# Patient Record
Sex: Male | Born: 1960 | Race: White | Hispanic: No | Marital: Single | Smoking: Current every day smoker
Health system: Southern US, Community
[De-identification: ages and names within clinical notes are randomized; demographics above are authoritative.]

## PROBLEM LIST (undated history)

## (undated) DIAGNOSIS — F1010 Alcohol abuse, uncomplicated: Secondary | ICD-10-CM

---

## 2003-10-12 ENCOUNTER — Emergency Department (HOSPITAL_COMMUNITY): Admission: EM | Admit: 2003-10-12 | Discharge: 2003-10-13 | Payer: Self-pay | Admitting: Emergency Medicine

## 2013-06-03 ENCOUNTER — Emergency Department (HOSPITAL_COMMUNITY): Payer: Medicare Other

## 2013-06-03 ENCOUNTER — Emergency Department (HOSPITAL_COMMUNITY)
Admission: EM | Admit: 2013-06-03 | Discharge: 2013-06-04 | Disposition: A | Discharge status: Home / Self Care | Payer: Medicare Other | Attending: Emergency Medicine | Admitting: Emergency Medicine

## 2013-06-03 ENCOUNTER — Encounter (HOSPITAL_COMMUNITY): Payer: Self-pay | Admitting: Emergency Medicine

## 2013-06-03 DIAGNOSIS — R112 Nausea with vomiting, unspecified: Secondary | ICD-10-CM | POA: Insufficient documentation

## 2013-06-03 DIAGNOSIS — R609 Edema, unspecified: Secondary | ICD-10-CM | POA: Insufficient documentation

## 2013-06-03 DIAGNOSIS — J189 Pneumonia, unspecified organism: Secondary | ICD-10-CM

## 2013-06-03 DIAGNOSIS — F17200 Nicotine dependence, unspecified, uncomplicated: Secondary | ICD-10-CM | POA: Insufficient documentation

## 2013-06-03 DIAGNOSIS — R0781 Pleurodynia: Secondary | ICD-10-CM

## 2013-06-03 DIAGNOSIS — J159 Unspecified bacterial pneumonia: Secondary | ICD-10-CM | POA: Insufficient documentation

## 2013-06-03 LAB — COMPREHENSIVE METABOLIC PANEL
ALT: 7 U/L (ref 0–53)
AST: 16 U/L (ref 0–37)
Albumin: 3.5 g/dL (ref 3.5–5.2)
Alkaline Phosphatase: 77 U/L (ref 39–117)
BUN: 3 mg/dL — ABNORMAL LOW (ref 6–23)
CO2: 20 mEq/L (ref 19–32)
Calcium: 8.3 mg/dL — ABNORMAL LOW (ref 8.4–10.5)
Chloride: 90 mEq/L — ABNORMAL LOW (ref 96–112)
Creatinine, Ser: 0.44 mg/dL — ABNORMAL LOW (ref 0.50–1.35)
GFR calc Af Amer: >90 mL/min (ref >90)
GFR calc non Af Amer: >90 mL/min (ref >90)
Glucose, Bld: 85 mg/dL (ref 70–99)
Potassium: 3.8 mEq/L (ref 3.7–5.3)
Sodium: 127 mEq/L — ABNORMAL LOW (ref 137–147)
Total Bilirubin: <0.2 mg/dL — ABNORMAL LOW (ref 0.3–1.2)
Total Protein: 6.6 g/dL (ref 6.0–8.3)

## 2013-06-03 LAB — CBC WITH DIFFERENTIAL/PLATELET
Basophils Absolute: 0 10*3/uL (ref 0.0–0.1)
Basophils Relative: 0 % (ref 0–1)
Eosinophils Absolute: 0.1 10*3/uL (ref 0.0–0.7)
Eosinophils Relative: 1 % (ref 0–5)
HCT: 32.5 % — ABNORMAL LOW (ref 39.0–52.0)
Hemoglobin: 11.5 g/dL — ABNORMAL LOW (ref 13.0–17.0)
Lymphocytes Relative: 39 % (ref 12–46)
Lymphs Abs: 3.9 10*3/uL (ref 0.7–4.0)
MCH: 34 pg (ref 26.0–34.0)
MCHC: 35.4 g/dL (ref 30.0–36.0)
MCV: 96.2 fL (ref 78.0–100.0)
Monocytes Absolute: 0.8 10*3/uL (ref 0.1–1.0)
Monocytes Relative: 8 % (ref 3–12)
Neutro Abs: 5 10*3/uL (ref 1.7–7.7)
Neutrophils Relative %: 52 % (ref 43–77)
Platelets: 276 10*3/uL (ref 150–400)
RBC: 3.38 MIL/uL — ABNORMAL LOW (ref 4.22–5.81)
RDW: 13.5 % (ref 11.5–15.5)
WBC: 9.9 10*3/uL (ref 4.0–10.5)

## 2013-06-03 LAB — ETHANOL: Alcohol, Ethyl (B): 314 mg/dL — ABNORMAL HIGH (ref 0–11)

## 2013-06-03 LAB — I-STAT TROPONIN, ED: Troponin i, poc: 0 ng/mL (ref 0.00–0.08)

## 2013-06-03 MED ORDER — LEVOFLOXACIN 750 MG PO TABS
750.0000 mg | ORAL_TABLET | Freq: Once | ORAL | Status: AC
  Administered 2013-06-04: 750 mg via ORAL
  Filled 2013-06-03 (×2): qty 1

## 2013-06-03 MED ORDER — NAPROXEN 500 MG PO TABS: 500.0000 mg | ORAL_TABLET | Freq: Two times a day (BID) | ORAL | Status: DC

## 2013-06-03 MED ORDER — LEVOFLOXACIN 750 MG PO TABS: 750.0000 mg | ORAL_TABLET | Freq: Every day | ORAL | Status: DC

## 2013-06-03 NOTE — ED Provider Notes (Signed)
Patient presented to the ER with chest pain. She was constant discomfort on the left side of his chest with a cough.  Face to face Exam: HEENT - PERRLA Lungs - CTAB Heart - RRR, no M/R/G Abd - S/NT/ND Neuro - alert, oriented x3  Plan: Cardiac workup is unremarkable. X-ray does show evidence of pneumonia, will initiate antibiotic coverage, analgesia, treat as an outpatient. Followup with primary doctor.   Gilda Crease, MD 06/03/13 [phone]

## 2013-06-03 NOTE — ED Notes (Signed)
Pt to ED via EMS with c/o chest pain onset yesterday with nausea/vomiting, black spider bite at left hand on Saturday(swelling noted). Pt admits for drinking 2 beers. Per EMS, pt given 324mg  ASA and sublingual nitro x3, BP-118/80, HR-110, O2-94% on room air, CBG-106.

## 2013-06-03 NOTE — Discharge Instructions (Signed)

## 2013-06-03 NOTE — ED Provider Notes (Signed)
CSN: [id number]     Arrival date & time 06/03/13  2058 History   First MD Initiated Contact with Patient 06/03/13 2122     Chief Complaint  Patient presents with  . Chest Pain  . Insect Bite     (Consider location/radiation/quality/duration/timing/severity/associated sxs/prior Treatment) Patient is a 53 y.o. male presenting with chest pain. The history is provided by the patient and the EMS personnel. No language interpreter was used.  Chest Pain Pain location:  L chest Pain quality: aching and throbbing   Pain radiates to:  Does not radiate Pain radiates to the back: no   Pain severity:  Moderate (worse with cough and palpation) Onset quality:  Sudden Duration:  1 day Timing:  Intermittent Progression:  Waxing and waning Chronicity:  New Worsened by:  Coughing Associated symptoms: cough, nausea and vomiting     History reviewed. No pertinent past medical history. History reviewed. No pertinent past surgical history. History reviewed. No pertinent family history. History  Substance Use Topics  . Smoking status: Current Every Day Smoker -- 2.00 packs/day    Types: Cigarettes  . Smokeless tobacco: Never Used  . Alcohol Use: Yes     Comment: daily     Review of Systems  Respiratory: Positive for cough.   Cardiovascular: Positive for chest pain.  Gastrointestinal: Positive for nausea and vomiting. Negative for blood in stool.  Musculoskeletal: Positive for arthralgias.  All other systems reviewed and are negative.     Allergies  Review of patient's allergies indicates no known allergies.  Home Medications   Prior to Admission medications   Not on File   BP 100/69  Pulse 79  Temp(Src) 97.6 F (36.4 C) (Oral)  Resp 14  SpO2 90% Physical Exam  Constitutional: He is oriented to person, place, and time.  HENT:  Head: Normocephalic and atraumatic.  Eyes: Pupils are equal, round, and reactive to light.  Neck: Normal range of motion.  Cardiovascular: Normal  rate and regular rhythm.   Pulmonary/Chest: Effort normal and breath sounds normal. He exhibits tenderness.  Abdominal: Soft. Bowel sounds are normal.  Musculoskeletal: He exhibits edema and tenderness.  Mild edema noted to dorsal aspect of left hand, ? Insect bite  Lymphadenopathy:    He has no cervical adenopathy.  Neurological: He is alert and oriented to person, place, and time.  Skin: Skin is warm and dry.  Psychiatric: He has a normal mood and affect.    ED Course  Procedures (including critical care time) Labs Review Labs Reviewed  CBC WITH DIFFERENTIAL  COMPREHENSIVE METABOLIC PANEL  ETHANOL  I-STAT TROPOININ, ED    Imaging Review No results found.   EKG Interpretation None     Radiology and lab results reviewed, shared with patient.  Concern for early RLL infiltrate.  Negative troponin.  Discussed with Dr. Blinda Leatherwood.  Will treat with levaquin to cover infiltrate and possible soft tissue infection.  Follow-up with PCP. MDM   Final diagnoses:  None    CAP.    Jimmye Norman, NP 06/04/13 [phone]

## 2013-06-04 NOTE — ED Provider Notes (Signed)
Medical screening examination/treatment/procedure(s) were conducted as a shared visit with non-physician practitioner(s) and myself.  I personally evaluated the patient during the encounter.  Please see separate associated note for evaluation and plan.    EKG Interpretation   Date/Time:  Tuesday Jun 03 2013 21:01:09 EDT Ventricular Rate:  83 PR Interval:  145 QRS Duration: 90 QT Interval:  381 QTC Calculation: 448 R Axis:   77 Text Interpretation:  Sinus rhythm Normal ECG Confirmed by Blinda Leatherwood  MD,  CHRISTOPHER ([id number]) on 06/03/2013 11:51:40 PM       Gilda Crease, MD 06/04/13 [phone]

## 2013-08-01 ENCOUNTER — Encounter (HOSPITAL_COMMUNITY): Payer: Self-pay | Admitting: Emergency Medicine

## 2013-08-01 ENCOUNTER — Emergency Department (HOSPITAL_COMMUNITY)
Admission: EM | Admit: 2013-08-01 | Discharge: 2013-08-01 | Discharge status: Against Medical Advice | Payer: Medicare Other | Attending: Emergency Medicine | Admitting: Emergency Medicine

## 2013-08-01 DIAGNOSIS — Y929 Unspecified place or not applicable: Secondary | ICD-10-CM | POA: Insufficient documentation

## 2013-08-01 DIAGNOSIS — W230XXA Caught, crushed, jammed, or pinched between moving objects, initial encounter: Secondary | ICD-10-CM | POA: Diagnosis not present

## 2013-08-01 DIAGNOSIS — S6990XA Unspecified injury of unspecified wrist, hand and finger(s), initial encounter: Principal | ICD-10-CM | POA: Insufficient documentation

## 2013-08-01 DIAGNOSIS — Y939 Activity, unspecified: Secondary | ICD-10-CM | POA: Insufficient documentation

## 2013-08-01 DIAGNOSIS — F17200 Nicotine dependence, unspecified, uncomplicated: Secondary | ICD-10-CM | POA: Diagnosis not present

## 2013-08-01 DIAGNOSIS — Z7982 Long term (current) use of aspirin: Secondary | ICD-10-CM | POA: Insufficient documentation

## 2013-08-01 DIAGNOSIS — S6980XA Other specified injuries of unspecified wrist, hand and finger(s), initial encounter: Secondary | ICD-10-CM | POA: Insufficient documentation

## 2013-08-01 NOTE — ED Notes (Signed)
Slammed L thumb in door on Wednesday.  C/o pain, ecchymosis, swelling, and difficulty bending thumb.

## 2013-08-01 NOTE — ED Notes (Signed)
Pt states his ride was here and he need to leave now, provider Ardelle Parkran PA-C made aware. Risks of leaving AMA explained. Pt informed to return to ED any time.

## 2013-08-29 ENCOUNTER — Encounter (HOSPITAL_COMMUNITY): Payer: Self-pay | Admitting: Emergency Medicine

## 2013-08-29 ENCOUNTER — Emergency Department (HOSPITAL_COMMUNITY): Payer: Medicare Other

## 2013-08-29 ENCOUNTER — Emergency Department (HOSPITAL_COMMUNITY)
Admission: EM | Admit: 2013-08-29 | Discharge: 2013-08-30 | Disposition: A | Discharge status: Home / Self Care | Payer: Medicare Other | Attending: Emergency Medicine | Admitting: Emergency Medicine

## 2013-08-29 DIAGNOSIS — R4182 Altered mental status, unspecified: Secondary | ICD-10-CM | POA: Insufficient documentation

## 2013-08-29 DIAGNOSIS — F17200 Nicotine dependence, unspecified, uncomplicated: Secondary | ICD-10-CM | POA: Insufficient documentation

## 2013-08-29 DIAGNOSIS — F1010 Alcohol abuse, uncomplicated: Secondary | ICD-10-CM | POA: Diagnosis not present

## 2013-08-29 DIAGNOSIS — F10920 Alcohol use, unspecified with intoxication, uncomplicated: Secondary | ICD-10-CM

## 2013-08-29 DIAGNOSIS — R079 Chest pain, unspecified: Secondary | ICD-10-CM | POA: Diagnosis not present

## 2013-08-29 DIAGNOSIS — Z79899 Other long term (current) drug therapy: Secondary | ICD-10-CM | POA: Diagnosis not present

## 2013-08-29 HISTORY — DX: Alcohol abuse, uncomplicated: F10.10

## 2013-08-29 LAB — ETHANOL: Alcohol, Ethyl (B): 338 mg/dL — ABNORMAL HIGH (ref 0–11)

## 2013-08-29 LAB — CBC
HCT: 36.6 % — ABNORMAL LOW (ref 39.0–52.0)
Hemoglobin: 12.9 g/dL — ABNORMAL LOW (ref 13.0–17.0)
MCH: 33.8 pg (ref 26.0–34.0)
MCHC: 35.2 g/dL (ref 30.0–36.0)
MCV: 95.8 fL (ref 78.0–100.0)
Platelets: 317 10*3/uL (ref 150–400)
RBC: 3.82 MIL/uL — ABNORMAL LOW (ref 4.22–5.81)
RDW: 13.5 % (ref 11.5–15.5)
WBC: 9.1 10*3/uL (ref 4.0–10.5)

## 2013-08-29 LAB — COMPREHENSIVE METABOLIC PANEL
ALT: 18 U/L (ref 0–53)
AST: 35 U/L (ref 0–37)
Albumin: 4 g/dL (ref 3.5–5.2)
Alkaline Phosphatase: 95 U/L (ref 39–117)
Anion gap: 17 — ABNORMAL HIGH (ref 5–15)
BUN: 4 mg/dL — ABNORMAL LOW (ref 6–23)
CO2: 20 mEq/L (ref 19–32)
Calcium: 8.8 mg/dL (ref 8.4–10.5)
Chloride: 97 mEq/L (ref 96–112)
Creatinine, Ser: 0.52 mg/dL (ref 0.50–1.35)
GFR calc Af Amer: >90 mL/min (ref >90)
GFR calc non Af Amer: >90 mL/min (ref >90)
Glucose, Bld: 83 mg/dL (ref 70–99)
Potassium: 3.9 mEq/L (ref 3.7–5.3)
Sodium: 134 mEq/L — ABNORMAL LOW (ref 137–147)
Total Bilirubin: <0.2 mg/dL — ABNORMAL LOW (ref 0.3–1.2)
Total Protein: 7.4 g/dL (ref 6.0–8.3)

## 2013-08-29 LAB — I-STAT TROPONIN, ED: Troponin i, poc: 0 ng/mL (ref 0.00–0.08)

## 2013-08-29 MED ORDER — NICOTINE 21 MG/24HR TD PT24
21.0000 mg | MEDICATED_PATCH | Freq: Once | TRANSDERMAL | Status: DC
  Administered 2013-08-29: 21 mg via TRANSDERMAL
  Filled 2013-08-29: qty 1
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
## 2013-08-29 NOTE — ED Notes (Signed)
rn asked if pt has any pain, pt reports he has "smoking pain". Meaning he wants to go smoke

## 2013-08-29 NOTE — ED Provider Notes (Signed)
CSN: [id number]     Arrival date & time 08/29/13  1814 History   First MD Initiated Contact with Patient 08/29/13 1817     Chief Complaint  Patient presents with  . Chest Pain     (Consider location/radiation/quality/duration/timing/severity/associated sxs/prior Treatment) HPI Comments: Patient who is an alcoholic, drinks several beers multiple times per day, apparently found lying outside. EMS was called for transport. Patient is clinically intoxicated. Stated he was walking outside looking for his dog when he had chest pain and fell to the ground. Denies hitting his head or having neck pain. Currently 2/10 L CP. He cannot give details about his risk factor profile. States he had a stroke in the 1980s but cannot tell me what caused this. Level V caveat due to CP.   Patient is a 53 y.o. male presenting with chest pain. The history is provided by the patient and the EMS personnel.  Chest Pain   Past Medical History  Diagnosis Date  . ETOH abuse    History reviewed. No pertinent past surgical history. No family history on file. History  Substance Use Topics  . Smoking status: Current Every Day Smoker -- 2.00 packs/day    Types: Cigarettes  . Smokeless tobacco: Never Used  . Alcohol Use: Yes     Comment: daily     Review of Systems  Unable to perform ROS: Mental status change  Cardiovascular: Positive for chest pain.      Allergies  Review of patient's allergies indicates no known allergies.  Home Medications   Prior to Admission medications   Medication Sig Start Date End Date Taking? Authorizing Provider  albuterol (PROVENTIL HFA;VENTOLIN HFA) 108 (90 BASE) MCG/ACT inhaler Inhale 2 puffs into the lungs every 6 (six) hours as needed for wheezing or shortness of breath.    Historical Provider, MD  Aspirin-Salicylamide-Caffeine (BC HEADACHE POWDER PO) Take 1 packet by mouth daily as needed (Pain).    Historical Provider, MD   BP 107/61  Pulse 85  Temp(Src) 97.8 F (36.6  C) (Oral)  Resp 16  SpO2 96%  Physical Exam  Nursing note and vitals reviewed. Constitutional: He is oriented to person, place, and time. He appears well-developed and well-nourished.  HENT:  Head: Normocephalic and atraumatic. Head is without raccoon's eyes and without Battle's sign.  Right Ear: Tympanic membrane, external ear and ear canal normal. No hemotympanum.  Left Ear: Tympanic membrane, external ear and ear canal normal. No hemotympanum.  Nose: Nose normal. No nasal septal hematoma.  Mouth/Throat: Oropharynx is clear and moist.  Eyes: Conjunctivae, EOM and lids are normal. Pupils are equal, round, and reactive to light. Right eye exhibits no discharge. Left eye exhibits no discharge.  No visible hyphema  Neck: Normal range of motion. Neck supple.  Cardiovascular: Normal rate, regular rhythm and normal heart sounds.   No murmur heard. Pulmonary/Chest: Effort normal and breath sounds normal. No respiratory distress. He has no wheezes. He has no rales.  Abdominal: Soft. There is no tenderness. There is no rebound and no guarding.  Musculoskeletal: Normal range of motion.       Cervical back: He exhibits normal range of motion, no tenderness and no bony tenderness.       Thoracic back: He exhibits no tenderness and no bony tenderness.       Lumbar back: He exhibits no tenderness and no bony tenderness.  Neurological: He is alert and oriented to person, place, and time. He has normal strength and normal reflexes. No  cranial nerve deficit or sensory deficit. Coordination abnormal. GCS eye subscore is 4. GCS verbal subscore is 5. GCS motor subscore is 6.  Slurred speech. Moves all extremities purposefully.   Skin: Skin is warm and dry.  Psychiatric: He has a normal mood and affect.    ED Course  Procedures (including critical care time) Labs Review Labs Reviewed  CBC - Abnormal; Notable for the following:    RBC 3.82 (*)    Hemoglobin 12.9 (*)    HCT 36.6 (*)    All other  components within normal limits  COMPREHENSIVE METABOLIC PANEL - Abnormal; Notable for the following:    Sodium 134 (*)    BUN 4 (*)    Total Bilirubin <0.2 (*)    Anion gap 17 (*)    All other components within normal limits  ETHANOL - Abnormal; Notable for the following:    Alcohol, Ethyl (B) 338 (*)    All other components within normal limits  I-STAT TROPOININ, ED    Imaging Review Dg Chest 2 View  08/29/2013   CLINICAL DATA:  Chest pain  EXAM: CHEST  2 VIEW  COMPARISON:  06/03/2013  FINDINGS: Cardiomediastinal silhouette is stable. Again noted hyperinflation. Emphysematous/bullous changes are again noted. No acute infiltrate or pleural effusion. No pulmonary edema.  IMPRESSION: Hyperinflation. Bilateral emphysematous changes again noted. No active disease.   Electronically Signed   By: Natasha Mead M.D.   On: 08/29/2013 19:57     EKG Interpretation None       7:00 PM Patient seen and examined. Work-up initiated. Medications ordered.   Vital signs reviewed and are as follows: BP 107/61  Pulse 85  Temp(Src) 97.8 F (36.6 C) (Oral)  Resp 16  SpO2 96%  1:45 AM Patient sleeping. Handoff to [facility] NP. Will discharged when sober and safe.    MDM   Final diagnoses:  Alcohol intoxication, uncomplicated   Patient with alcohol intoxication. He c/o CP but cardiac work-up neg. I do not suspect ACS. No risk factors for PE other than age. Do not suspect PE.   He is being allowed to sober up in ED. Will discharge when safe.     Renne Crigler, PA-C 08/30/13 [phone]

## 2013-08-29 NOTE — ED Notes (Addendum)
Per ems pt is from home, pt was laying in the yard, pt thought his dog was in the yard and he was going to get the dog and pt fell, has ingested 6-7 beers. ETOH intoxication. Reports left flank pain, en route complaining of chest pain, EKG unremarkable. 324 mg aspirin PO given. 1 nitro tablet given at 1704

## 2013-08-29 NOTE — ED Notes (Signed)
Bed: [facility] Expected date:  Expected time:  Means of arrival:  Comments: 
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
## 2013-08-29 NOTE — ED Notes (Signed)
Patient transported to X-ray 

## 2013-08-29 NOTE — ED Notes (Signed)
Back from xray

## 2013-08-30 NOTE — ED Provider Notes (Signed)
Medical screening examination/treatment/procedure(s) were performed by non-physician practitioner and as supervising physician I was immediately available for consultation/collaboration.   EKG Interpretation None        Artemus Romanoff, MD 08/30/13 0701 

## 2013-08-30 NOTE — ED Provider Notes (Signed)
Patient has been ambulating, to the bathroom without difficulty he is speaking clear, concise sentences.  Will be discharged home  Arman Filter, NP 08/30/13 [phone]

## 2013-08-30 NOTE — ED Provider Notes (Signed)
Medical screening examination/treatment/procedure(s) were performed by non-physician practitioner and as supervising physician I was immediately available for consultation/collaboration.   EKG Interpretation None        Richardean Canal, MD 08/30/13 [phone]

## 2013-08-30 NOTE — ED Notes (Signed)
Pt has ambulated multiple times to the bathroom with out difficulty and without assistance.

## 2013-08-30 NOTE — Discharge Instructions (Signed)
Please read and follow all provided instructions.  Your diagnoses today include:  1. Alcohol intoxication, uncomplicated    Tests performed today include:  EKG  Blood test for heart muscle damage - no heart attack  Blood counts and electrolytes  Alcohol level - over 4 times the legal limit.  Vital signs. See below for your results today.   Medications prescribed:   None  Take any prescribed medications only as directed.  Home care instructions:  Follow any educational materials contained in this packet.  BE VERY CAREFUL not to take multiple medicines containing Tylenol (also called acetaminophen). Doing so can lead to an overdose which can damage your liver and cause liver failure and possibly death.   Follow-up instructions: Please follow-up with your primary care provider in the next 3 days for further evaluation of your symptoms.   Return instructions:   Please return to the Emergency Department if you experience worsening symptoms.   Please return if you have any other emergent concerns.  Additional Information:  Your vital signs today were: BP 107/61   Pulse 85   Temp(Src) 97.8 F (36.6 C) (Oral)   Resp 16   SpO2 96% If your blood pressure (BP) was elevated above 135/85 this visit, please have this repeated by your doctor within one month. --------------

## 2015-01-22 ENCOUNTER — Encounter (HOSPITAL_COMMUNITY): Payer: Self-pay

## 2015-01-22 ENCOUNTER — Emergency Department (HOSPITAL_COMMUNITY): Payer: Medicare Other

## 2015-01-22 ENCOUNTER — Inpatient Hospital Stay (HOSPITAL_COMMUNITY): Payer: Medicare Other

## 2015-01-22 ENCOUNTER — Inpatient Hospital Stay (HOSPITAL_COMMUNITY)
Admission: EM | Admit: 2015-01-22 | Discharge: 2015-02-10 | DRG: 515 | Disposition: A | Discharge status: Skilled Nursing Facility | Payer: Medicare Other | Attending: General Surgery | Admitting: General Surgery

## 2015-01-22 DIAGNOSIS — E162 Hypoglycemia, unspecified: Secondary | ICD-10-CM | POA: Diagnosis not present

## 2015-01-22 DIAGNOSIS — E46 Unspecified protein-calorie malnutrition: Secondary | ICD-10-CM

## 2015-01-22 DIAGNOSIS — G9340 Encephalopathy, unspecified: Secondary | ICD-10-CM | POA: Diagnosis not present

## 2015-01-22 DIAGNOSIS — Z4659 Encounter for fitting and adjustment of other gastrointestinal appliance and device: Secondary | ICD-10-CM

## 2015-01-22 DIAGNOSIS — Z978 Presence of other specified devices: Secondary | ICD-10-CM

## 2015-01-22 DIAGNOSIS — S0281XA Fracture of other specified skull and facial bones, right side, initial encounter for closed fracture: Principal | ICD-10-CM | POA: Diagnosis present

## 2015-01-22 DIAGNOSIS — Z23 Encounter for immunization: Secondary | ICD-10-CM

## 2015-01-22 DIAGNOSIS — R911 Solitary pulmonary nodule: Secondary | ICD-10-CM | POA: Diagnosis present

## 2015-01-22 DIAGNOSIS — R4182 Altered mental status, unspecified: Secondary | ICD-10-CM | POA: Diagnosis present

## 2015-01-22 DIAGNOSIS — F1020 Alcohol dependence, uncomplicated: Secondary | ICD-10-CM | POA: Diagnosis present

## 2015-01-22 DIAGNOSIS — Z681 Body mass index (BMI) 19 or less, adult: Secondary | ICD-10-CM

## 2015-01-22 DIAGNOSIS — R64 Cachexia: Secondary | ICD-10-CM | POA: Diagnosis present

## 2015-01-22 DIAGNOSIS — S02401A Maxillary fracture, unspecified, initial encounter for closed fracture: Secondary | ICD-10-CM

## 2015-01-22 DIAGNOSIS — D62 Acute posthemorrhagic anemia: Secondary | ICD-10-CM | POA: Diagnosis not present

## 2015-01-22 DIAGNOSIS — F17200 Nicotine dependence, unspecified, uncomplicated: Secondary | ICD-10-CM | POA: Diagnosis present

## 2015-01-22 DIAGNOSIS — S060X9A Concussion with loss of consciousness of unspecified duration, initial encounter: Secondary | ICD-10-CM | POA: Diagnosis not present

## 2015-01-22 DIAGNOSIS — J9600 Acute respiratory failure, unspecified whether with hypoxia or hypercapnia: Secondary | ICD-10-CM | POA: Diagnosis present

## 2015-01-22 DIAGNOSIS — Y908 Blood alcohol level of 240 mg/100 ml or more: Secondary | ICD-10-CM | POA: Diagnosis not present

## 2015-01-22 DIAGNOSIS — J449 Chronic obstructive pulmonary disease, unspecified: Secondary | ICD-10-CM | POA: Diagnosis not present

## 2015-01-22 DIAGNOSIS — S0285XA Fracture of orbit, unspecified, initial encounter for closed fracture: Secondary | ICD-10-CM | POA: Diagnosis present

## 2015-01-22 DIAGNOSIS — I639 Cerebral infarction, unspecified: Secondary | ICD-10-CM

## 2015-01-22 DIAGNOSIS — R410 Disorientation, unspecified: Secondary | ICD-10-CM | POA: Diagnosis not present

## 2015-01-22 DIAGNOSIS — F1010 Alcohol abuse, uncomplicated: Secondary | ICD-10-CM | POA: Diagnosis present

## 2015-01-22 DIAGNOSIS — R9389 Abnormal findings on diagnostic imaging of other specified body structures: Secondary | ICD-10-CM

## 2015-01-22 LAB — URINALYSIS, ROUTINE W REFLEX MICROSCOPIC
Bilirubin Urine: NEGATIVE
GLUCOSE, UA: NEGATIVE mg/dL
HGB URINE DIPSTICK: NEGATIVE
KETONES UR: NEGATIVE mg/dL
LEUKOCYTES UA: NEGATIVE
Nitrite: NEGATIVE
PROTEIN: NEGATIVE mg/dL
Specific Gravity, Urine: 1.011 (ref 1.005–1.030)
pH: 6 (ref 5.0–8.0)

## 2015-01-22 LAB — COMPREHENSIVE METABOLIC PANEL
ALT: 10 U/L — AB (ref 17–63)
AST: 26 U/L (ref 15–41)
Albumin: 3.7 g/dL (ref 3.5–5.0)
Alkaline Phosphatase: 92 U/L (ref 38–126)
Anion gap: 13 (ref 5–15)
BUN: <5 mg/dL — ABNORMAL LOW (ref 6–20)
CHLORIDE: 91 mmol/L — AB (ref 101–111)
CO2: 22 mmol/L (ref 22–32)
Calcium: 8.4 mg/dL — ABNORMAL LOW (ref 8.9–10.3)
Creatinine, Ser: 0.59 mg/dL — ABNORMAL LOW (ref 0.61–1.24)
GFR calc non Af Amer: >60 mL/min (ref >60)
Glucose, Bld: 88 mg/dL (ref 65–99)
POTASSIUM: 4.1 mmol/L (ref 3.5–5.1)
SODIUM: 126 mmol/L — AB (ref 135–145)
Total Bilirubin: 0.4 mg/dL (ref 0.3–1.2)
Total Protein: 6.5 g/dL (ref 6.5–8.1)

## 2015-01-22 LAB — I-STAT CHEM 8, ED
BUN: 4 mg/dL — AB (ref 6–20)
CHLORIDE: 89 mmol/L — AB (ref 101–111)
Calcium, Ion: 0.95 mmol/L — ABNORMAL LOW (ref 1.12–1.23)
Creatinine, Ser: 0.9 mg/dL (ref 0.61–1.24)
Glucose, Bld: 89 mg/dL (ref 65–99)
HEMATOCRIT: 43 % (ref 39.0–52.0)
Hemoglobin: 14.6 g/dL (ref 13.0–17.0)
Potassium: 3.8 mmol/L (ref 3.5–5.1)
SODIUM: 125 mmol/L — AB (ref 135–145)
TCO2: 21 mmol/L (ref 0–100)

## 2015-01-22 LAB — CBC
HEMATOCRIT: 36.6 % — AB (ref 39.0–52.0)
HEMOGLOBIN: 12.7 g/dL — AB (ref 13.0–17.0)
MCH: 32.6 pg (ref 26.0–34.0)
MCHC: 34.7 g/dL (ref 30.0–36.0)
MCV: 93.8 fL (ref 78.0–100.0)
Platelets: 278 10*3/uL (ref 150–400)
RBC: 3.9 MIL/uL — AB (ref 4.22–5.81)
RDW: 13.2 % (ref 11.5–15.5)
WBC: 14.5 10*3/uL — ABNORMAL HIGH (ref 4.0–10.5)

## 2015-01-22 LAB — PREPARE FRESH FROZEN PLASMA
UNIT DIVISION: 0
Unit division: 0

## 2015-01-22 LAB — I-STAT ARTERIAL BLOOD GAS, ED
Acid-base deficit: 5 mmol/L — ABNORMAL HIGH (ref 0.0–2.0)
Bicarbonate: 21.1 meq/L (ref 20.0–24.0)
O2 SAT: 98 %
PCO2 ART: 40.5 mmHg (ref 35.0–45.0)
PH ART: 7.319 — AB (ref 7.350–7.450)
TCO2: 22 mmol/L (ref 0–100)
pO2, Arterial: 110 mmHg — ABNORMAL HIGH (ref 80.0–100.0)

## 2015-01-22 LAB — PROTIME-INR
INR: 0.97 (ref 0.00–1.49)
Prothrombin Time: 13.1 seconds (ref 11.6–15.2)

## 2015-01-22 LAB — ABO/RH: ABO/RH(D): O NEG

## 2015-01-22 LAB — CBG MONITORING, ED: GLUCOSE-CAPILLARY: 94 mg/dL (ref 65–99)

## 2015-01-22 LAB — ETHANOL: Alcohol, Ethyl (B): 314 mg/dL (ref <5)

## 2015-01-22 LAB — CDS SEROLOGY

## 2015-01-22 MED ORDER — ETOMIDATE 2 MG/ML IV SOLN
INTRAVENOUS | Status: AC | PRN
  Administered 2015-01-22: 20 mg via INTRAVENOUS

## 2015-01-22 MED ORDER — SODIUM CHLORIDE 0.9 % IV SOLN
25.0000 ug/h | INTRAVENOUS | Status: DC
  Administered 2015-01-23: 50 ug/h via INTRAVENOUS
  Administered 2015-01-24: 100 ug/h via INTRAVENOUS
  Administered 2015-01-24: 55 ug/h via INTRAVENOUS
  Filled 2015-01-22 (×4): qty 50

## 2015-01-22 MED ORDER — M.V.I. ADULT IV INJ
INJECTION | Freq: Once | INTRAVENOUS | Status: AC
  Administered 2015-01-22: 23:00:00 via INTRAVENOUS
  Filled 2015-01-22: qty 1000

## 2015-01-22 MED ORDER — SODIUM CHLORIDE 0.9 % IV SOLN
INTRAVENOUS | Status: AC | PRN
  Administered 2015-01-22: 1000 mL via INTRAVENOUS

## 2015-01-22 MED ORDER — FENTANYL BOLUS VIA INFUSION
50.0000 ug | INTRAVENOUS | Status: DC | PRN
  Administered 2015-01-25 (×2): 50 ug via INTRAVENOUS
  Filled 2015-01-22: qty 50

## 2015-01-22 MED ORDER — FENTANYL CITRATE (PF) 100 MCG/2ML IJ SOLN: 50.0000 ug | Freq: Once | INTRAMUSCULAR | Status: DC

## 2015-01-22 MED ORDER — ROCURONIUM BROMIDE 50 MG/5ML IV SOLN
INTRAVENOUS | Status: AC | PRN
  Administered 2015-01-22: 70 mg via INTRAVENOUS

## 2015-01-22 MED ORDER — PROPOFOL 1000 MG/100ML IV EMUL
5.0000 ug/kg/min | Freq: Once | INTRAVENOUS | Status: AC
Start: 2015-01-22 — End: 2015-01-22
  Administered 2015-01-22: 20 ug/kg/min via INTRAVENOUS

## 2015-01-22 MED ORDER — PROPOFOL 1000 MG/100ML IV EMUL
0.0000 ug/kg/min | INTRAVENOUS | Status: DC
  Administered 2015-01-22: 5 ug/kg/min via INTRAVENOUS

## 2015-01-22 MED ORDER — IOHEXOL 300 MG/ML  SOLN
80.0000 mL | Freq: Once | INTRAMUSCULAR | Status: AC | PRN
  Administered 2015-01-22: 80 mL via INTRAVENOUS

## 2015-01-22 NOTE — Progress Notes (Signed)
RT assisted ED Physician with intubation. ETCO2 positive color change. Bilateral breath sounds present. CXR confirmed placement. ABG pending. RT will continue to monitor.

## 2015-01-22 NOTE — ED Notes (Signed)
CBG 94 

## 2015-01-22 NOTE — ED Notes (Signed)
Heavy etoh on board.

## 2015-01-22 NOTE — Progress Notes (Signed)
RT decreased RR to 16 and decrease Tidal Volume to 430 Per MD order. RT decreased FIO2 to 50% per ABG results. RT will continue to monitor.

## 2015-01-22 NOTE — ED Notes (Signed)
Propofol cut off due to bp.
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
## 2015-01-22 NOTE — ED Notes (Signed)
See trauma narrator 
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
## 2015-01-22 NOTE — ED Notes (Signed)
ett placed per md.

## 2015-01-22 NOTE — ED Notes (Signed)
Attempted report 

## 2015-01-23 ENCOUNTER — Inpatient Hospital Stay (HOSPITAL_COMMUNITY): Payer: Medicare Other

## 2015-01-23 LAB — CBC
HEMATOCRIT: 35.4 % — AB (ref 39.0–52.0)
Hemoglobin: 12.3 g/dL — ABNORMAL LOW (ref 13.0–17.0)
MCH: 33.2 pg (ref 26.0–34.0)
MCHC: 34.7 g/dL (ref 30.0–36.0)
MCV: 95.7 fL (ref 78.0–100.0)
Platelets: 284 10*3/uL (ref 150–400)
RBC: 3.7 MIL/uL — AB (ref 4.22–5.81)
RDW: 13.5 % (ref 11.5–15.5)
WBC: 15.8 10*3/uL — AB (ref 4.0–10.5)

## 2015-01-23 LAB — TYPE AND SCREEN
ABO/RH(D): O NEG
Antibody Screen: NEGATIVE
Unit division: 0
Unit division: 0

## 2015-01-23 LAB — BASIC METABOLIC PANEL
Anion gap: 9 (ref 5–15)
CALCIUM: 7.8 mg/dL — AB (ref 8.9–10.3)
CHLORIDE: 101 mmol/L (ref 101–111)
CO2: 20 mmol/L — AB (ref 22–32)
CREATININE: 0.41 mg/dL — AB (ref 0.61–1.24)
GFR calc non Af Amer: >60 mL/min (ref >60)
Glucose, Bld: 95 mg/dL (ref 65–99)
Potassium: 3.8 mmol/L (ref 3.5–5.1)
SODIUM: 130 mmol/L — AB (ref 135–145)

## 2015-01-23 LAB — POCT I-STAT 3, ART BLOOD GAS (G3+)
Acid-base deficit: 7 mmol/L — ABNORMAL HIGH (ref 0.0–2.0)
Bicarbonate: 18.4 meq/L — ABNORMAL LOW (ref 20.0–24.0)
O2 Saturation: 96 %
PCO2 ART: 34.8 mmHg — AB (ref 35.0–45.0)
PH ART: 7.329 — AB (ref 7.350–7.450)
Patient temperature: 97.3
TCO2: 20 mmol/L (ref 0–100)
pO2, Arterial: 86 mmHg (ref 80.0–100.0)

## 2015-01-23 LAB — BLOOD PRODUCT ORDER (VERBAL) VERIFICATION

## 2015-01-23 LAB — GLUCOSE, CAPILLARY: Glucose-Capillary: 89 mg/dL (ref 65–99)

## 2015-01-23 LAB — MRSA PCR SCREENING: MRSA BY PCR: NEGATIVE

## 2015-01-23 LAB — TRIGLYCERIDES: TRIGLYCERIDES: 138 mg/dL (ref <150)

## 2015-01-23 MED ORDER — PANTOPRAZOLE SODIUM 40 MG IV SOLR
40.0000 mg | INTRAVENOUS | Status: DC
  Administered 2015-01-23 – 2015-01-31 (×9): 40 mg via INTRAVENOUS
  Filled 2015-01-23 (×11): qty 40

## 2015-01-23 MED ORDER — ANTISEPTIC ORAL RINSE SOLUTION (CORINZ)
7.0000 mL | Freq: Four times a day (QID) | OROMUCOSAL | Status: DC
  Administered 2015-01-23 – 2015-02-01 (×36): 7 mL via OROMUCOSAL

## 2015-01-23 MED ORDER — CHLORHEXIDINE GLUCONATE 0.12% ORAL RINSE (MEDLINE KIT)
15.0000 mL | Freq: Two times a day (BID) | OROMUCOSAL | Status: DC
  Administered 2015-01-23 – 2015-02-01 (×19): 15 mL via OROMUCOSAL

## 2015-01-23 MED ORDER — SODIUM CHLORIDE 0.9 % IV SOLN
INTRAVENOUS | Status: DC | PRN
  Administered 2015-01-23: 04:00:00 via INTRAVENOUS
  Administered 2015-01-24: 60 mL/h via INTRAVENOUS
  Administered 2015-01-27: 23:00:00 via INTRAVENOUS
  Administered 2015-01-27: 60 mL/h via INTRAVENOUS
  Administered 2015-01-30 – 2015-02-01 (×3): via INTRAVENOUS
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
## 2015-01-23 NOTE — Progress Notes (Signed)
Patient transported from ED to CT scan then up to 12M without complications. RT will continue to monitor.
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
## 2015-01-23 NOTE — Progress Notes (Signed)
eLink Physician-Brief Progress Note Patient Name: Gene Clarke DOB: September 08, 1960 MRN: [id number]   Date of Service  01/23/2015  HPI/Events of Note  Notified of need for stress ulcer prophylaxis. Patient is intubated and mechanically ventilated.  eICU Interventions  Will order Protonix IV.     Intervention Category Intermediate Interventions: Best-practice therapies (e.g. DVT, beta blocker, etc.)  [name] 01/23/2015, 3:52 PM

## 2015-01-24 ENCOUNTER — Inpatient Hospital Stay (HOSPITAL_COMMUNITY): Payer: Medicare Other

## 2015-01-24 LAB — CBC
HEMATOCRIT: 34.9 % — AB (ref 39.0–52.0)
HEMOGLOBIN: 11.8 g/dL — AB (ref 13.0–17.0)
MCH: 32.6 pg (ref 26.0–34.0)
MCHC: 33.8 g/dL (ref 30.0–36.0)
MCV: 96.4 fL (ref 78.0–100.0)
Platelets: 280 10*3/uL (ref 150–400)
RBC: 3.62 MIL/uL — ABNORMAL LOW (ref 4.22–5.81)
RDW: 13.5 % (ref 11.5–15.5)
WBC: 11.1 10*3/uL — AB (ref 4.0–10.5)

## 2015-01-24 LAB — BASIC METABOLIC PANEL
ANION GAP: 10 (ref 5–15)
BUN: <5 mg/dL — ABNORMAL LOW (ref 6–20)
CHLORIDE: 104 mmol/L (ref 101–111)
CO2: 23 mmol/L (ref 22–32)
Calcium: 8.8 mg/dL — ABNORMAL LOW (ref 8.9–10.3)
Creatinine, Ser: 0.46 mg/dL — ABNORMAL LOW (ref 0.61–1.24)
GFR calc non Af Amer: >60 mL/min (ref >60)
Glucose, Bld: 94 mg/dL (ref 65–99)
POTASSIUM: 4.1 mmol/L (ref 3.5–5.1)
Sodium: 137 mmol/L (ref 135–145)

## 2015-01-24 LAB — GLUCOSE, CAPILLARY: Glucose-Capillary: 101 mg/dL — ABNORMAL HIGH (ref 65–99)

## 2015-01-24 MED ORDER — MIDAZOLAM HCL 2 MG/2ML IJ SOLN
2.0000 mg | INTRAMUSCULAR | Status: DC | PRN
  Administered 2015-01-27 – 2015-02-01 (×11): 2 mg via INTRAVENOUS
  Filled 2015-01-24 (×10): qty 2

## 2015-01-24 MED ORDER — MIDAZOLAM HCL 2 MG/2ML IJ SOLN
2.0000 mg | INTRAMUSCULAR | Status: DC | PRN
  Filled 2015-01-24 (×3): qty 2

## 2015-01-24 NOTE — Progress Notes (Signed)
Wasted 150cc of fentanyl 2,500 mcg/22650ml concentration into sink, witnessed by nurse Darci NeedleGary Yeung
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
## 2015-01-24 NOTE — Progress Notes (Signed)
Hr 50s QT interval 0.41, fentanyl drip 30 mcg/hr, pt arouses from sleep w/nonpurposeful movements, cbg 10. will titrate fentanyl drip down. Dr Dimple Caseyice informed

## 2015-01-24 NOTE — Progress Notes (Addendum)
Follow up - Trauma and Critical Care  Patient Details:    Gene Clarke is an 55 y.o. male.  Lines/tubes : Airway 7.5 mm (Active)  Secured at (cm) 25 cm 01/24/2015  4:00 AM  Measured From Lips 01/24/2015  7:48 AM  Secured Location Left 01/24/2015  3:27 AM  Secured By [employer] Tube Holder 01/24/2015  7:48 AM  Tube Holder Repositioned Yes 01/24/2015  3:27 AM  Cuff Pressure (cm H2O) 28 cm H2O 01/24/2015  3:27 AM  Site Condition Dry 01/24/2015  7:48 AM     Urethral Catheter brownrn  Temperature probe 16 Fr. (Active)  Indication for Insertion or Continuance of Catheter Unstable critical patients (first 24-48 hours) 01/24/2015  7:48 AM  Site Assessment Clean;Intact 01/24/2015  7:48 AM  Catheter Maintenance Bag below level of bladder;Catheter secured;Drainage bag/tubing not touching floor;Insertion date on drainage bag;No dependent loops;Seal intact 01/24/2015  7:48 AM  Collection Container Standard drainage bag 01/24/2015  7:48 AM  Securement Method Leg strap 01/24/2015  7:48 AM  Urinary Catheter Interventions Unclamped 01/24/2015  7:48 AM  Output (mL) 18 mL 01/24/2015  8:00 AM    Microbiology/Sepsis markers: Results for orders placed or performed during the hospital encounter of 01/22/15  MRSA PCR Screening     Status: None   Collection Time: 01/23/15 12:23 AM  Result Value Ref Range Status   MRSA by PCR NEGATIVE NEGATIVE Final    Comment:        [name] (FDA approved for NASAL specimens only), is one component of a comprehensive MRSA colonization surveillance program. It is not intended to diagnose MRSA infection nor to guide or monitor treatment for MRSA infections.     Anti-infectives:  Anti-infectives    None      Best Practice/Protocols:  VTE Prophylaxis: Mechanical Intermittent Sedation  Consults: Treatment Team:  Newman PiesSu Teoh, MD    Events:  Subjective:    Overnight Issues: Some secretions overnight  Objective:  Vital signs for last 24  hours: Temp:  [98.6 F (37 C)-99.5 F (37.5 C)] 98.9 F (37.2 C) (01/15 0752) Pulse Rate:  [59-107] 80 (01/15 0800) Resp:  [10-21] 10 (01/15 0800) BP: (99-135)/(60-92) 110/75 mmHg (01/15 0800) SpO2:  [96 %-100 %] 98 % (01/15 0800) FiO2 (%):  [40 %] 40 % (01/15 0800)  Hemodynamic parameters for last 24 hours:    Intake/Output from previous day: 01/14 0701 - 01/15 0700 In: 539.5 [I.V.:539.5] Out: 2105 [Urine:2105]  Intake/Output this shift: Total I/O In: 10 [I.V.:10] Out: 18 [Urine:18]  Vent settings for last 24 hours: Vent Mode:  [-] PRVC FiO2 (%):  [40 %] 40 % Set Rate:  [16 bmp] 16 bmp Vt Set:  [430 mL] 430 mL PEEP:  [5 cmH20] 5 cmH20 Pressure Support:  [5 cmH20] 5 cmH20 Plateau Pressure:  [11 cmH20-14 cmH20] 11 cmH20  Physical Exam:  Gen: intubated, sedated HEENT: ETT in place Resp: clear b/l Cardiovascular: RRR Abdomen: soft, NT, ND Ext: min edema Neuro: GCS 6T  Results for orders placed or performed during the hospital encounter of 01/22/15 (from the past 24 hour(s))  I-STAT 3, arterial blood gas (G3+)     Status: Abnormal   Collection Time: 01/23/15  9:54 AM  Result Value Ref Range   pH, Arterial 7.329 (L) 7.350 - 7.450   pCO2 arterial 34.8 (L) 35.0 - 45.0 mmHg   pO2, Arterial 86.0 80.0 - 100.0 mmHg   Bicarbonate 18.4 (L) 20.0 - 24.0 mEq/L   TCO2 20 0 -  100 mmol/L   O2 Saturation 96.0 %   Acid-base deficit 7.0 (H) 0.0 - 2.0 mmol/L   Patient temperature 97.3 F    Collection site RADIAL, ALLEN'S TEST ACCEPTABLE    Sample type ARTERIAL   Provider-confirm verbal Blood Bank order - RBC, FFP, Type & Screen; 2 Units; Order taken: 01/22/2015; 8:55 PM; Emergency Release, STAT 2 units of uncrossmatched emergency release O negative red cells and 2 units of emergency release A plasma were prep     Status: None   Collection Time: 01/23/15  2:41 PM  Result Value Ref Range   Blood product order confirm MD AUTHORIZATION REQUESTED   CBC     Status: Abnormal   Collection  Time: 01/24/15  2:20 AM  Result Value Ref Range   WBC 11.1 (H) 4.0 - 10.5 K/uL   RBC 3.62 (L) 4.22 - 5.81 MIL/uL   Hemoglobin 11.8 (L) 13.0 - 17.0 g/dL   HCT 16.1 (L) 09.6 - 04.5 %   MCV 96.4 78.0 - 100.0 fL   MCH 32.6 26.0 - 34.0 pg   MCHC 33.8 30.0 - 36.0 g/dL   RDW 40.9 81.1 - 91.4 %   Platelets 280 150 - 400 K/uL  Basic metabolic panel     Status: Abnormal   Collection Time: 01/24/15  2:20 AM  Result Value Ref Range   Sodium 137 135 - 145 mmol/L   Potassium 4.1 3.5 - 5.1 mmol/L   Chloride 104 101 - 111 mmol/L   CO2 23 22 - 32 mmol/L   Glucose, Bld 94 65 - 99 mg/dL   BUN <5 (L) 6 - 20 mg/dL   Creatinine, Ser 7.82 (L) 0.61 - 1.24 mg/dL   Calcium 8.8 (L) 8.9 - 10.3 mg/dL   GFR calc non Af Amer >60 >60 mL/min   GFR calc Af Amer >60 >60 mL/min   Anion gap 10 5 - 15     Assessment/Plan:   NEURO  Concussion, alcoholism   Plan: wean sedation and continue to assess responsiveness, watch for withdrawal symptoms  PULM  WBC down, acute respiratory failure from encephalopathy, baseline COPD from imaging   Plan: SBT today, reassess in 2h  CARDIO  No issues   Plan: continue fluids  RENAL  No issues   Plan: continues fluids  GI  NPO   Plan: if unable to extubate, will start tube feeds  ID  Initial concern pneumonia, WBC improved   Plan: watch for signs of infection  HEME  No issues   Plan: start chemical proph  ENDO No issues   Plan: monitor glc  Global Issues      LOS: 2 days   Additional comments:I reviewed the patients new imaging test results. stable chest image  Critical Care Total Time*: 15 Minutes  De Blanch Devansh Riese 01/24/2015  *Care during the described time interval was provided by me and/or other providers on the critical care team.  I have reviewed this patient's available data, including medical history, events of note, physical examination and test results as part of my evaluation.     Addendum: After 2 hours of SBT/sedation hold he still has  no purposeful movements. -no extubation attempt today -unable to place OG yesterday. May attempt today -switch back to [facility] -resume sedation with fentanyl and intermittent versed
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
## 2015-01-25 LAB — BASIC METABOLIC PANEL
ANION GAP: 10 (ref 5–15)
BUN: <5 mg/dL — ABNORMAL LOW (ref 6–20)
CALCIUM: 8.6 mg/dL — AB (ref 8.9–10.3)
CO2: 24 mmol/L (ref 22–32)
Chloride: 106 mmol/L (ref 101–111)
Creatinine, Ser: 0.53 mg/dL — ABNORMAL LOW (ref 0.61–1.24)
GFR calc Af Amer: >60 mL/min (ref >60)
GLUCOSE: 94 mg/dL (ref 65–99)
Potassium: 4 mmol/L (ref 3.5–5.1)
Sodium: 140 mmol/L (ref 135–145)

## 2015-01-25 LAB — CBC
HCT: 33.7 % — ABNORMAL LOW (ref 39.0–52.0)
Hemoglobin: 11 g/dL — ABNORMAL LOW (ref 13.0–17.0)
MCH: 32.2 pg (ref 26.0–34.0)
MCHC: 32.6 g/dL (ref 30.0–36.0)
MCV: 98.5 fL (ref 78.0–100.0)
PLATELETS: 304 10*3/uL (ref 150–400)
RBC: 3.42 MIL/uL — ABNORMAL LOW (ref 4.22–5.81)
RDW: 13.8 % (ref 11.5–15.5)
WBC: 12.7 10*3/uL — AB (ref 4.0–10.5)

## 2015-01-25 LAB — TRIGLYCERIDES: Triglycerides: 58 mg/dL (ref <150)

## 2015-01-25 MED ORDER — LORAZEPAM 1 MG PO TABS: 1.0000 mg | ORAL_TABLET | Freq: Four times a day (QID) | ORAL | Status: AC | PRN

## 2015-01-25 MED ORDER — FOLIC ACID 1 MG PO TABS
1.0000 mg | ORAL_TABLET | Freq: Every day | ORAL | Status: DC
  Filled 2015-01-25: qty 1

## 2015-01-25 MED ORDER — LORAZEPAM 2 MG/ML IJ SOLN: 1.0000 mg | Freq: Four times a day (QID) | INTRAMUSCULAR | Status: AC | PRN

## 2015-01-25 MED ORDER — ADULT MULTIVITAMIN W/MINERALS CH
1.0000 | ORAL_TABLET | Freq: Every day | ORAL | Status: DC
  Administered 2015-01-28 – 2015-02-10 (×11): 1 via ORAL
  Filled 2015-01-25 (×14): qty 1

## 2015-01-25 MED ORDER — VITAMIN B-1 100 MG PO TABS
100.0000 mg | ORAL_TABLET | Freq: Every day | ORAL | Status: DC
  Administered 2015-01-28 – 2015-02-02 (×5): 100 mg via ORAL
  Filled 2015-01-25 (×9): qty 1

## 2015-01-25 MED ORDER — ENOXAPARIN SODIUM 40 MG/0.4ML ~~LOC~~ SOLN
40.0000 mg | SUBCUTANEOUS | Status: DC
  Administered 2015-01-25 – 2015-01-27 (×3): 40 mg via SUBCUTANEOUS
  Filled 2015-01-25 (×3): qty 0.4

## 2015-01-25 MED ORDER — FOLIC ACID 5 MG/ML IJ SOLN
1.0000 mg | Freq: Every day | INTRAMUSCULAR | Status: DC
  Administered 2015-01-25 – 2015-02-10 (×16): 1 mg via INTRAVENOUS
  Filled 2015-01-25 (×18): qty 0.2

## 2015-01-25 MED ORDER — THIAMINE HCL 100 MG/ML IJ SOLN
100.0000 mg | Freq: Every day | INTRAMUSCULAR | Status: DC
  Administered 2015-01-25 – 2015-01-31 (×4): 100 mg via INTRAVENOUS
  Filled 2015-01-25 (×9): qty 1
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
## 2015-01-25 NOTE — Care Management Important Message (Signed)
Important Message  Patient Details  Name: Gene Clarke MRN: [id number] Date of Birth: [birth year]   Medicare Important Message Given:  Yes    Bernadette HoitShoffner, Joanne Brander Coleman 01/25/2015, 12:32 PM

## 2015-01-25 NOTE — Progress Notes (Signed)
Cm

## 2015-01-26 ENCOUNTER — Inpatient Hospital Stay (HOSPITAL_COMMUNITY): Payer: Medicare Other

## 2015-01-26 LAB — CBC
HCT: 33.9 % — ABNORMAL LOW (ref 39.0–52.0)
HEMOGLOBIN: 11 g/dL — AB (ref 13.0–17.0)
MCH: 32.2 pg (ref 26.0–34.0)
MCHC: 32.4 g/dL (ref 30.0–36.0)
MCV: 99.1 fL (ref 78.0–100.0)
Platelets: 319 10*3/uL (ref 150–400)
RBC: 3.42 MIL/uL — AB (ref 4.22–5.81)
RDW: 13.9 % (ref 11.5–15.5)
WBC: 16.8 10*3/uL — AB (ref 4.0–10.5)

## 2015-01-26 LAB — BASIC METABOLIC PANEL
ANION GAP: 15 (ref 5–15)
CHLORIDE: 104 mmol/L (ref 101–111)
CO2: 23 mmol/L (ref 22–32)
Calcium: 9 mg/dL (ref 8.9–10.3)
Creatinine, Ser: 0.54 mg/dL — ABNORMAL LOW (ref 0.61–1.24)
GFR calc Af Amer: >60 mL/min (ref >60)
GLUCOSE: 102 mg/dL — AB (ref 65–99)
POTASSIUM: 3.8 mmol/L (ref 3.5–5.1)
Sodium: 142 mmol/L (ref 135–145)

## 2015-01-26 MED ORDER — ATROPINE SULFATE 0.1 MG/ML IJ SOLN
INTRAMUSCULAR | Status: AC
  Filled 2015-01-26: qty 10

## 2015-01-26 NOTE — Progress Notes (Signed)
Patient ID: Gene Clarke, male   DOB: 1960-03-31, 55 y.o.   MRN: [id number] Weaned very well. Was hoping to extubate but, per RT and RN, still having a lot of secretions and not F/C. Will repeat CT head in the AM to evaluate further.  Violeta Gelinas, MD, MPH, FACS Trauma: [phone] General Surgery: [phone]

## 2015-01-26 NOTE — Progress Notes (Signed)
Patient ID: Gene Clarke, male   DOB: 1960/04/28, 55 y.o.   MRN: [id number] Follow up - Trauma Critical Care  Patient Details:    Gene Clarke is an 55 y.o. male.  Lines/tubes : Airway 7.5 mm (Active)  Secured at (cm) 25 cm 01/26/2015  3:33 AM  Measured From Lips 01/26/2015  3:33 AM  Secured Location Right 01/26/2015  3:33 AM  Secured By [employer] 01/26/2015  3:33 AM  Tube Holder Repositioned Yes 01/26/2015  3:33 AM  Cuff Pressure (cm H2O) 24 cm H2O 01/25/2015  7:39 PM  Site Condition Dry 01/25/2015  8:00 PM     Urethral Catheter brownrn  Temperature probe 16 Fr. (Active)  Indication for Insertion or Continuance of Catheter Unstable critical patients (first 24-48 hours) 01/26/2015  7:09 AM  Site Assessment Clean;Intact 01/25/2015  8:00 PM  Catheter Maintenance Bag below level of bladder;Catheter secured;Drainage bag/tubing not touching floor;Insertion date on drainage bag;No dependent loops;Seal intact 01/26/2015  7:09 AM  Collection Container Standard drainage bag 01/25/2015  8:00 PM  Securement Method Leg strap 01/25/2015  8:00 PM  Urinary Catheter Interventions Unclamped 01/25/2015  8:00 PM  Output (mL) 75 mL 01/26/2015  6:00 AM    Microbiology/Sepsis markers: Results for orders placed or performed during the hospital encounter of 01/22/15  MRSA PCR Screening     Status: None   Collection Time: 01/23/15 12:23 AM  Result Value Ref Range Status   MRSA by PCR NEGATIVE NEGATIVE Final    Comment:        [name] (FDA approved for NASAL specimens only), is one component of a comprehensive MRSA colonization surveillance program. It is not intended to diagnose MRSA infection nor to guide or monitor treatment for MRSA infections.     Anti-infectives:  Anti-infectives    None      Best Practice/Protocols:  VTE Prophylaxis: Lovenox (prophylaxtic dose) Continous Sedation  Consults: Treatment Team:  Newman Pies, MD   Subjective:    Overnight Issues:   stable Objective:  Vital signs for last 24 hours: Temp:  [97.5 F (36.4 C)-99.4 F (37.4 C)] 98.8 F (37.1 C) (01/17 0336) Pulse Rate:  [54-144] 54 (01/17 0600) Resp:  [14-22] 18 (01/17 0600) BP: (100-179)/(63-107) 112/70 mmHg (01/17 0600) SpO2:  [96 %-100 %] 99 % (01/17 0600) FiO2 (%):  [40 %] 40 % (01/17 0400) Weight:  [43.3 kg (95 lb 7.4 oz)] 43.3 kg (95 lb 7.4 oz) (01/17 0400)  Hemodynamic parameters for last 24 hours:    Intake/Output from previous day: 01/16 0701 - 01/17 0700 In: 1434 [I.V.:1434] Out: 930 [Urine:930]  Intake/Output this shift:    Vent settings for last 24 hours: Vent Mode:  [-] PRVC FiO2 (%):  [40 %] 40 % Set Rate:  [16 bmp] 16 bmp Vt Set:  [430 mL] 430 mL PEEP:  [5 cmH20] 5 cmH20 Pressure Support:  [5 cmH20] 5 cmH20 Plateau Pressure:  [10 cmH20-14 cmH20] 12 cmH20  Physical Exam:  General: on vent Neuro: opens eyes, PERL, moves ext some but not F/C, brisk WD to pain HEENT/Neck: ETT and R facial edema Resp: clear to auscultation bilaterally CVS: RRR GI: soft, NT, ND Extremities: calves soft  Results for orders placed or performed during the hospital encounter of 01/22/15 (from the past 24 hour(s))  Triglycerides     Status: None   Collection Time: 01/25/15 10:04 PM  Result Value Ref Range   Triglycerides 58 <150 mg/dL  CBC     Status:  Abnormal   Collection Time: 01/26/15  2:32 AM  Result Value Ref Range   WBC 16.8 (H) 4.0 - 10.5 K/uL   RBC 3.42 (L) 4.22 - 5.81 MIL/uL   Hemoglobin 11.0 (L) 13.0 - 17.0 g/dL   HCT 40.9 (L) 81.1 - 91.4 %   MCV 99.1 78.0 - 100.0 fL   MCH 32.2 26.0 - 34.0 pg   MCHC 32.4 30.0 - 36.0 g/dL   RDW 78.2 95.6 - 21.3 %   Platelets 319 150 - 400 K/uL  Basic metabolic panel     Status: Abnormal   Collection Time: 01/26/15  2:32 AM  Result Value Ref Range   Sodium 142 135 - 145 mmol/L   Potassium 3.8 3.5 - 5.1 mmol/L   Chloride 104 101 - 111 mmol/L   CO2 23 22 - 32 mmol/L   Glucose, Bld 102 (H) 65 - 99 mg/dL    BUN <5 (L) 6 - 20 mg/dL   Creatinine, Ser 0.86 (L) 0.61 - 1.24 mg/dL   Calcium 9.0 8.9 - 57.8 mg/dL   GFR calc non Af Amer >60 >60 mL/min   GFR calc Af Amer >60 >60 mL/min   Anion gap 15 5 - 15    Assessment & Plan: Present on Admission:  . Orbital fracture (HCC)   LOS: 4 days   Additional comments:I reviewed the patient's new clinical lab test results. . Fall Vent dependent resp failure - plan wean to extubate today R tetrapod facial FXs - per Dr. Suszanne Conners, possible ORIF Concussion - some improvement, will need therapies after extubation ABL anemia - mild, stable ETOH abuse - watch for WD, CIWA RUL lung nodule - outpatient F/U FEN - NPO on vent, hold off on TF with planned extubation VTE - PAS, Lovenox Dispo - ICU Critical Care Total Time*: 30 Minutes  Violeta Gelinas, MD, MPH, FACS Trauma: [phone] General Surgery: [phone]  01/26/2015  *Care during the described time interval was provided by me. I have reviewed this patient's available data, including medical history, events of note, physical examination and test results as part of my evaluation.

## 2015-01-26 NOTE — Progress Notes (Signed)
Spoke with Violeta Gelinas, MD about extubating patient.  Patient occasionally having thick secretions, building up and causing distress and tachypnea.  Reccommended that i would avoid extubation at this time, MD agreed.  MD verbal order for cortrak NG tube.
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
## 2015-01-26 NOTE — Progress Notes (Signed)
This RN and Maylon Peppers RD, attempted to place cortrak tube. Tube advanced at ~52cm but met resistance to further advancement. According to cortrak imaging, tube appears to be right before the stomach. Stat Abd CXR ordered to verify placement. May need IR placement.
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
## 2015-01-26 NOTE — Progress Notes (Signed)
Wasted 100 mL of Fentanyl with Barbaraann Faster, in sink. Bag expired, new bag hung

## 2015-01-27 ENCOUNTER — Inpatient Hospital Stay (HOSPITAL_COMMUNITY): Payer: Medicare Other

## 2015-01-27 LAB — BASIC METABOLIC PANEL
Anion gap: 10 (ref 5–15)
BUN: 8 mg/dL (ref 6–20)
CHLORIDE: 110 mmol/L (ref 101–111)
CO2: 23 mmol/L (ref 22–32)
Calcium: 8.7 mg/dL — ABNORMAL LOW (ref 8.9–10.3)
Creatinine, Ser: 0.51 mg/dL — ABNORMAL LOW (ref 0.61–1.24)
GFR calc Af Amer: >60 mL/min (ref >60)
GFR calc non Af Amer: >60 mL/min (ref >60)
Glucose, Bld: 94 mg/dL (ref 65–99)
POTASSIUM: 3.4 mmol/L — AB (ref 3.5–5.1)
SODIUM: 143 mmol/L (ref 135–145)

## 2015-01-27 LAB — CBC
HCT: 31.3 % — ABNORMAL LOW (ref 39.0–52.0)
HEMOGLOBIN: 10.3 g/dL — AB (ref 13.0–17.0)
MCH: 32.9 pg (ref 26.0–34.0)
MCHC: 32.9 g/dL (ref 30.0–36.0)
MCV: 100 fL (ref 78.0–100.0)
Platelets: 308 10*3/uL (ref 150–400)
RBC: 3.13 MIL/uL — AB (ref 4.22–5.81)
RDW: 13.8 % (ref 11.5–15.5)
WBC: 11.7 10*3/uL — ABNORMAL HIGH (ref 4.0–10.5)

## 2015-01-27 LAB — HEPATIC FUNCTION PANEL
ALT: 11 U/L — AB (ref 17–63)
AST: 18 U/L (ref 15–41)
Albumin: 2.7 g/dL — ABNORMAL LOW (ref 3.5–5.0)
Alkaline Phosphatase: 70 U/L (ref 38–126)
BILIRUBIN DIRECT: 0.1 mg/dL (ref 0.1–0.5)
Indirect Bilirubin: 0.5 mg/dL (ref 0.3–0.9)
TOTAL PROTEIN: 6 g/dL — AB (ref 6.5–8.1)
Total Bilirubin: 0.6 mg/dL (ref 0.3–1.2)

## 2015-01-27 LAB — GLUCOSE, CAPILLARY
GLUCOSE-CAPILLARY: 84 mg/dL (ref 65–99)
Glucose-Capillary: 109 mg/dL — ABNORMAL HIGH (ref 65–99)

## 2015-01-27 LAB — VITAMIN B12: Vitamin B-12: 553 pg/mL (ref 180–914)

## 2015-01-27 LAB — AMMONIA: Ammonia: 34 umol/L (ref 9–35)

## 2015-01-27 MED ORDER — ENOXAPARIN SODIUM 30 MG/0.3ML ~~LOC~~ SOLN
30.0000 mg | SUBCUTANEOUS | Status: DC
  Administered 2015-01-28 – 2015-02-10 (×13): 30 mg via SUBCUTANEOUS
  Filled 2015-01-27 (×14): qty 0.3

## 2015-01-27 MED ORDER — POTASSIUM CHLORIDE 10 MEQ/100ML IV SOLN
10.0000 meq | INTRAVENOUS | Status: AC
  Administered 2015-01-27 (×2): 10 meq via INTRAVENOUS
  Filled 2015-01-27 (×2): qty 100

## 2015-01-27 MED ORDER — LIDOCAINE VISCOUS 2 % MT SOLN
OROMUCOSAL | Status: AC
  Administered 2015-01-27: 3 mL via NASAL
  Filled 2015-01-27: qty 15

## 2015-01-27 MED ORDER — IOHEXOL 300 MG/ML  SOLN
50.0000 mL | Freq: Once | INTRAMUSCULAR | Status: AC | PRN
  Administered 2015-01-27: 30 mL

## 2015-01-27 MED ORDER — PIVOT 1.5 CAL PO LIQD
1000.0000 mL | ORAL | Status: DC
  Administered 2015-01-27 – 2015-01-31 (×5): 1000 mL
  Filled 2015-01-27 (×9): qty 1000

## 2015-01-27 NOTE — Progress Notes (Signed)
Wasted 225 mL of fentanyl with Cher Nakai, RN in sink
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
## 2015-01-28 ENCOUNTER — Inpatient Hospital Stay (HOSPITAL_COMMUNITY): Payer: Medicare Other

## 2015-01-28 DIAGNOSIS — R4182 Altered mental status, unspecified: Secondary | ICD-10-CM

## 2015-01-28 LAB — CBC
HEMATOCRIT: 31.2 % — AB (ref 39.0–52.0)
Hemoglobin: 10.2 g/dL — ABNORMAL LOW (ref 13.0–17.0)
MCH: 31.9 pg (ref 26.0–34.0)
MCHC: 32.7 g/dL (ref 30.0–36.0)
MCV: 97.5 fL (ref 78.0–100.0)
Platelets: 312 10*3/uL (ref 150–400)
RBC: 3.2 MIL/uL — ABNORMAL LOW (ref 4.22–5.81)
RDW: 13.3 % (ref 11.5–15.5)
WBC: 10.9 10*3/uL — ABNORMAL HIGH (ref 4.0–10.5)

## 2015-01-28 LAB — PHOSPHORUS: PHOSPHORUS: 2.6 mg/dL (ref 2.5–4.6)

## 2015-01-28 LAB — GLUCOSE, CAPILLARY
GLUCOSE-CAPILLARY: 113 mg/dL — AB (ref 65–99)
GLUCOSE-CAPILLARY: 125 mg/dL — AB (ref 65–99)
GLUCOSE-CAPILLARY: 143 mg/dL — AB (ref 65–99)
Glucose-Capillary: 155 mg/dL — ABNORMAL HIGH (ref 65–99)
Glucose-Capillary: 147 mg/dL — ABNORMAL HIGH (ref 65–99)
Glucose-Capillary: 122 mg/dL — ABNORMAL HIGH (ref 65–99)

## 2015-01-28 LAB — BASIC METABOLIC PANEL
Anion gap: 10 (ref 5–15)
BUN: 10 mg/dL (ref 6–20)
CO2: 25 mmol/L (ref 22–32)
CREATININE: 0.42 mg/dL — AB (ref 0.61–1.24)
Calcium: 8.6 mg/dL — ABNORMAL LOW (ref 8.9–10.3)
Chloride: 110 mmol/L (ref 101–111)
GFR calc Af Amer: >60 mL/min (ref >60)
GLUCOSE: 169 mg/dL — AB (ref 65–99)
POTASSIUM: 3.2 mmol/L — AB (ref 3.5–5.1)
SODIUM: 145 mmol/L (ref 135–145)

## 2015-01-28 LAB — TRIGLYCERIDES: Triglycerides: 38 mg/dL (ref <150)

## 2015-01-28 LAB — MAGNESIUM: Magnesium: 1.9 mg/dL (ref 1.7–2.4)

## 2015-01-28 MED ORDER — POTASSIUM CHLORIDE 20 MEQ PO PACK: 40.0000 meq | PACK | Freq: Two times a day (BID) | ORAL | Status: DC

## 2015-01-28 MED ORDER — POTASSIUM CHLORIDE CRYS ER 20 MEQ PO TBCR
40.0000 meq | EXTENDED_RELEASE_TABLET | Freq: Two times a day (BID) | ORAL | Status: AC
  Administered 2015-01-28 (×2): 40 meq via ORAL
  Filled 2015-01-28 (×2): qty 2

## 2015-01-28 NOTE — Procedures (Signed)
History: 55 year old male being evaluated for altered mental status, unresponsiveness  Sedation: None  Technique: This is a 21 channel routine scalp EEG performed at the bedside with bipolar and monopolar montages arranged in accordance to the international 10/20 system of electrode placement. One channel was dedicated to EKG recording.    Background: The background consists of generalized relatively low voltage irregular delta activity. There is a posterior dominant rhythm (PDR) which is poorly sustained seen at times achieving a maximal frequency of 7 Hz.   Photic stimulation: Physiologic driving was not performed  EEG Abnormalities: 1) generalized irregular slow activity  2) slow PDR   Clinical Interpretation: This EEG is consistent with a nonspecific generalized cerebral dysfunction (encephalopathy). There was no seizure or seizure predisposition recorded on this study. Please note that a normal EEG does not preclude the possibility of epilepsy.   Ritta Slot, MD [facility] [phone]  If 7pm- 7am, please page neurology on call as listed in AMION.
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
## 2015-01-28 NOTE — Care Management Note (Signed)
Case Management Note  Patient Details  Name: Gene Clarke MRN: [id number] Date of Birth: June 28, 1960  Subjective/Objective:                    Action/Plan:  Ur updated  Expected Discharge Date:                  Expected Discharge Plan:  Home w Home Health Services  In-House Referral:     Discharge planning Services  CM Consult  Post Acute Care Choice:    Choice offered to:     DME Arranged:    DME Agency:     HH Arranged:    HH Agency:     Status of Service:  In process, will continue to follow  Medicare Important Message Given:  Yes Date Medicare IM Given:    Medicare IM give by:    Date Additional Medicare IM Given:    Additional Medicare Important Message give by:     If discussed at Long Length of Stay Meetings, dates discussed:  01-28-15  Additional Comments:  Kingsley Plan, RN 01/28/2015, 11:01 AM

## 2015-01-28 NOTE — Progress Notes (Signed)
EEG Completed; Results Pending  

## 2015-01-29 ENCOUNTER — Inpatient Hospital Stay (HOSPITAL_COMMUNITY): Payer: Medicare Other

## 2015-01-29 DIAGNOSIS — R410 Disorientation, unspecified: Secondary | ICD-10-CM

## 2015-01-29 LAB — BASIC METABOLIC PANEL
Anion gap: 8 (ref 5–15)
BUN: 14 mg/dL (ref 6–20)
CO2: 25 mmol/L (ref 22–32)
CREATININE: 0.36 mg/dL — AB (ref 0.61–1.24)
Calcium: 9 mg/dL (ref 8.9–10.3)
Chloride: 113 mmol/L — ABNORMAL HIGH (ref 101–111)
GFR calc Af Amer: >60 mL/min (ref >60)
GLUCOSE: 140 mg/dL — AB (ref 65–99)
POTASSIUM: 3.5 mmol/L (ref 3.5–5.1)
Sodium: 146 mmol/L — ABNORMAL HIGH (ref 135–145)

## 2015-01-29 LAB — GLUCOSE, CAPILLARY
GLUCOSE-CAPILLARY: 121 mg/dL — AB (ref 65–99)
Glucose-Capillary: 108 mg/dL — ABNORMAL HIGH (ref 65–99)
Glucose-Capillary: 115 mg/dL — ABNORMAL HIGH (ref 65–99)
Glucose-Capillary: 118 mg/dL — ABNORMAL HIGH (ref 65–99)
Glucose-Capillary: 118 mg/dL — ABNORMAL HIGH (ref 65–99)
Glucose-Capillary: 126 mg/dL — ABNORMAL HIGH (ref 65–99)

## 2015-01-29 MED ORDER — FREE WATER
100.0000 mL | Freq: Three times a day (TID) | Status: DC
  Administered 2015-01-29 – 2015-02-02 (×15): 100 mL
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
## 2015-01-29 NOTE — Progress Notes (Signed)
Pt ETT was  22/21 at the lip. Previous position was 25/24 at the lip. RT was notified and advance ET tube back to 24 at the lip
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
## 2015-01-30 LAB — GLUCOSE, CAPILLARY
GLUCOSE-CAPILLARY: 116 mg/dL — AB (ref 65–99)
GLUCOSE-CAPILLARY: 123 mg/dL — AB (ref 65–99)
GLUCOSE-CAPILLARY: 103 mg/dL — AB (ref 65–99)
GLUCOSE-CAPILLARY: 97 mg/dL (ref 65–99)
Glucose-Capillary: 120 mg/dL — ABNORMAL HIGH (ref 65–99)
Glucose-Capillary: 123 mg/dL — ABNORMAL HIGH (ref 65–99)

## 2015-01-30 LAB — BASIC METABOLIC PANEL
Anion gap: 8 (ref 5–15)
BUN: 14 mg/dL (ref 6–20)
CALCIUM: 8.6 mg/dL — AB (ref 8.9–10.3)
CHLORIDE: 110 mmol/L (ref 101–111)
CO2: 24 mmol/L (ref 22–32)
CREATININE: 0.34 mg/dL — AB (ref 0.61–1.24)
GFR calc non Af Amer: >60 mL/min (ref >60)
GLUCOSE: 124 mg/dL — AB (ref 65–99)
Potassium: 3.2 mmol/L — ABNORMAL LOW (ref 3.5–5.1)
Sodium: 142 mmol/L (ref 135–145)

## 2015-01-30 MED ORDER — POTASSIUM CHLORIDE 20 MEQ PO PACK: 40.0000 meq | PACK | Freq: Two times a day (BID) | ORAL | Status: DC

## 2015-01-30 MED ORDER — FENTANYL CITRATE (PF) 100 MCG/2ML IJ SOLN
25.0000 ug | INTRAMUSCULAR | Status: DC | PRN
Start: 2015-01-30 — End: 2015-02-01
  Administered 2015-01-30 – 2015-02-01 (×6): 50 ug via INTRAVENOUS
  Filled 2015-01-30 (×7): qty 2

## 2015-01-30 MED ORDER — POTASSIUM CHLORIDE 20 MEQ/15ML (10%) PO SOLN
40.0000 meq | Freq: Every day | ORAL | Status: DC
  Administered 2015-01-30 – 2015-02-10 (×9): 40 meq via ORAL
  Filled 2015-01-30 (×10): qty 30

## 2015-01-30 MED ORDER — POTASSIUM CHLORIDE CRYS ER 20 MEQ PO TBCR: 40.0000 meq | EXTENDED_RELEASE_TABLET | Freq: Two times a day (BID) | ORAL | Status: DC
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
## 2015-01-30 NOTE — Progress Notes (Signed)
Pt ETT is back to 21 at the lip. RT was notified.

## 2015-01-31 LAB — GLUCOSE, CAPILLARY
GLUCOSE-CAPILLARY: 122 mg/dL — AB (ref 65–99)
GLUCOSE-CAPILLARY: 94 mg/dL (ref 65–99)
GLUCOSE-CAPILLARY: 93 mg/dL (ref 65–99)
Glucose-Capillary: 120 mg/dL — ABNORMAL HIGH (ref 65–99)
Glucose-Capillary: 120 mg/dL — ABNORMAL HIGH (ref 65–99)
Glucose-Capillary: 96 mg/dL (ref 65–99)

## 2015-01-31 LAB — BASIC METABOLIC PANEL
Anion gap: 7 (ref 5–15)
BUN: 15 mg/dL (ref 6–20)
CHLORIDE: 111 mmol/L (ref 101–111)
CO2: 24 mmol/L (ref 22–32)
CREATININE: 0.35 mg/dL — AB (ref 0.61–1.24)
Calcium: 8.5 mg/dL — ABNORMAL LOW (ref 8.9–10.3)
GFR calc Af Amer: >60 mL/min (ref >60)
GFR calc non Af Amer: >60 mL/min (ref >60)
Glucose, Bld: 114 mg/dL — ABNORMAL HIGH (ref 65–99)
Potassium: 3.6 mmol/L (ref 3.5–5.1)
Sodium: 142 mmol/L (ref 135–145)

## 2015-01-31 LAB — CBC
HEMATOCRIT: 29.7 % — AB (ref 39.0–52.0)
HEMOGLOBIN: 9.9 g/dL — AB (ref 13.0–17.0)
MCH: 32.6 pg (ref 26.0–34.0)
MCHC: 33.3 g/dL (ref 30.0–36.0)
MCV: 97.7 fL (ref 78.0–100.0)
Platelets: 281 10*3/uL (ref 150–400)
RBC: 3.04 MIL/uL — ABNORMAL LOW (ref 4.22–5.81)
RDW: 13.6 % (ref 11.5–15.5)
WBC: 12.8 10*3/uL — ABNORMAL HIGH (ref 4.0–10.5)

## 2015-01-31 LAB — TRIGLYCERIDES: Triglycerides: 25 mg/dL (ref <150)
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
## 2015-01-31 NOTE — Progress Notes (Signed)
RN found ET tube positioned at 21 cm/lip.  Advanced tube 2 cm.
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
## 2015-01-31 NOTE — Progress Notes (Signed)
Changed pads on aspen collar white outgoing RN maintained C-Spine alignment and imobilization

## 2015-02-01 LAB — GLUCOSE, CAPILLARY
GLUCOSE-CAPILLARY: 105 mg/dL — AB (ref 65–99)
GLUCOSE-CAPILLARY: 114 mg/dL — AB (ref 65–99)
GLUCOSE-CAPILLARY: 94 mg/dL (ref 65–99)
Glucose-Capillary: 133 mg/dL — ABNORMAL HIGH (ref 65–99)
Glucose-Capillary: 93 mg/dL (ref 65–99)
Glucose-Capillary: 88 mg/dL (ref 65–99)

## 2015-02-01 LAB — BASIC METABOLIC PANEL
Anion gap: 8 (ref 5–15)
BUN: 13 mg/dL (ref 6–20)
CALCIUM: 8.8 mg/dL — AB (ref 8.9–10.3)
CO2: 25 mmol/L (ref 22–32)
CREATININE: 0.35 mg/dL — AB (ref 0.61–1.24)
Chloride: 109 mmol/L (ref 101–111)
GFR calc non Af Amer: >60 mL/min (ref >60)
Glucose, Bld: 127 mg/dL — ABNORMAL HIGH (ref 65–99)
Potassium: 4.3 mmol/L (ref 3.5–5.1)
SODIUM: 142 mmol/L (ref 135–145)

## 2015-02-01 LAB — CBC
HCT: 32.1 % — ABNORMAL LOW (ref 39.0–52.0)
Hemoglobin: 10.7 g/dL — ABNORMAL LOW (ref 13.0–17.0)
MCH: 32.7 pg (ref 26.0–34.0)
MCHC: 33.3 g/dL (ref 30.0–36.0)
MCV: 98.2 fL (ref 78.0–100.0)
PLATELETS: 313 10*3/uL (ref 150–400)
RBC: 3.27 MIL/uL — ABNORMAL LOW (ref 4.22–5.81)
RDW: 13.5 % (ref 11.5–15.5)
WBC: 13.4 10*3/uL — AB (ref 4.0–10.5)

## 2015-02-01 MED ORDER — PNEUMOCOCCAL VAC POLYVALENT 25 MCG/0.5ML IJ INJ
0.5000 mL | INJECTION | INTRAMUSCULAR | Status: AC
  Administered 2015-02-02: 0.5 mL via INTRAMUSCULAR
  Filled 2015-02-01: qty 0.5

## 2015-02-01 MED ORDER — PANTOPRAZOLE SODIUM 40 MG PO PACK
40.0000 mg | PACK | Freq: Every day | ORAL | Status: DC
  Administered 2015-02-01 – 2015-02-08 (×5): 40 mg
  Filled 2015-02-01 (×8): qty 20

## 2015-02-01 MED ORDER — CETYLPYRIDINIUM CHLORIDE 0.05 % MT LIQD
7.0000 mL | Freq: Two times a day (BID) | OROMUCOSAL | Status: DC
  Administered 2015-02-01 – 2015-02-10 (×17): 7 mL via OROMUCOSAL
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
## 2015-02-01 NOTE — Procedures (Signed)
Extubation Procedure Note  Patient Details:   Name: Gene Clarke DOB: 01/27/1960 MRN: [id number]   Airway Documentation:     Evaluation  O2 sats: stable throughout and currently acceptable Complications: No apparent complications Patient did tolerate procedure well. Bilateral Breath Sounds: Rhonchi Suctioning: Airway  Prior to extubation: pt suctioned orally, via subglottic and ETTs, positive cuff leak noted.  Post-extubation: pt able to cough to produce sputum, no stridor noted.  Antoine Poche 02/01/2015, 8:38 AM
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
## 2015-02-02 DIAGNOSIS — S0281XA Fracture of other specified skull and facial bones, right side, initial encounter for closed fracture: Secondary | ICD-10-CM | POA: Diagnosis not present

## 2015-02-02 LAB — CBC
HEMATOCRIT: 32.8 % — AB (ref 39.0–52.0)
Hemoglobin: 10.9 g/dL — ABNORMAL LOW (ref 13.0–17.0)
MCH: 31.6 pg (ref 26.0–34.0)
MCHC: 33.2 g/dL (ref 30.0–36.0)
MCV: 95.1 fL (ref 78.0–100.0)
Platelets: 348 10*3/uL (ref 150–400)
RBC: 3.45 MIL/uL — ABNORMAL LOW (ref 4.22–5.81)
RDW: 13.1 % (ref 11.5–15.5)
WBC: 12.7 10*3/uL — ABNORMAL HIGH (ref 4.0–10.5)

## 2015-02-02 LAB — GLUCOSE, CAPILLARY
GLUCOSE-CAPILLARY: 122 mg/dL — AB (ref 65–99)
GLUCOSE-CAPILLARY: 111 mg/dL — AB (ref 65–99)
Glucose-Capillary: 108 mg/dL — ABNORMAL HIGH (ref 65–99)
Glucose-Capillary: 111 mg/dL — ABNORMAL HIGH (ref 65–99)
Glucose-Capillary: 111 mg/dL — ABNORMAL HIGH (ref 65–99)
Glucose-Capillary: 73 mg/dL (ref 65–99)
Glucose-Capillary: 98 mg/dL (ref 65–99)

## 2015-02-02 LAB — BASIC METABOLIC PANEL
ANION GAP: 10 (ref 5–15)
BUN: 6 mg/dL (ref 6–20)
CALCIUM: 8.8 mg/dL — AB (ref 8.9–10.3)
CO2: 24 mmol/L (ref 22–32)
Chloride: 104 mmol/L (ref 101–111)
Creatinine, Ser: 0.38 mg/dL — ABNORMAL LOW (ref 0.61–1.24)
GFR calc Af Amer: >60 mL/min (ref >60)
GFR calc non Af Amer: >60 mL/min (ref >60)
GLUCOSE: 117 mg/dL — AB (ref 65–99)
POTASSIUM: 3.4 mmol/L — AB (ref 3.5–5.1)
Sodium: 138 mmol/L (ref 135–145)

## 2015-02-02 MED ORDER — PIVOT 1.5 CAL PO LIQD
1000.0000 mL | ORAL | Status: DC
  Administered 2015-02-06 – 2015-02-08 (×3): 1000 mL
  Filled 2015-02-02 (×9): qty 1000

## 2015-02-02 MED ORDER — SODIUM CHLORIDE 0.9 % IV SOLN: INTRAVENOUS | Status: DC

## 2015-02-02 MED ORDER — PIVOT 1.5 CAL PO LIQD
1000.0000 mL | ORAL | Status: DC
  Administered 2015-02-02: 1000 mL
  Filled 2015-02-02 (×2): qty 1000

## 2015-02-02 MED ORDER — PIVOT 1.5 CAL PO LIQD: 1000.0000 mL | ORAL | Status: DC
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
## 2015-02-02 NOTE — Progress Notes (Signed)
Inpatient Rehabilitation  PT is recommending CIR pending further mobility assessment.  Note OT and SLP are recommending SNF.  We could consider a CIR consult if pt. begins to progress and show improved tolerance to therapies, however at this time, we are not recommending IP Rehab consult.  Please call if questions.  Weldon Picking PT Inpatient Rehab Admissions Coordinator Cell [phone] Office [phone]
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
## 2015-02-02 NOTE — Progress Notes (Signed)
Patient arrived around 105, is lethargic and not communicating, resumed nasogastric tube feed at 45 ml/hr, vitals are stable at this time. ORIF surgery scheduled for AM on 1/25 will continue to monitor.
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
## 2015-02-02 NOTE — Progress Notes (Addendum)
Notified MD Gaynelle Adu in regards to patient's CBG being 59,( concerned for hypoglycemia) and currently NPO. Was extubated on 02/01/15, asked if tube feedings could be restarted since patient is not able to follow commands at this time. Patient is alert but not oriented. MD Gaynelle Adu ordered to restart tube feedings.
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
## 2015-02-03 ENCOUNTER — Encounter (HOSPITAL_COMMUNITY): Admission: EM | Disposition: A | Discharge status: Skilled Nursing Facility | Payer: Self-pay | Source: Home / Self Care

## 2015-02-03 ENCOUNTER — Inpatient Hospital Stay (HOSPITAL_COMMUNITY): Payer: Medicare Other | Admitting: Anesthesiology

## 2015-02-03 ENCOUNTER — Encounter (HOSPITAL_COMMUNITY): Payer: Self-pay | Admitting: Anesthesiology

## 2015-02-03 HISTORY — PX: ORIF TRIPOD FRACTURE: SHX5211

## 2015-02-03 LAB — GLUCOSE, CAPILLARY
Glucose-Capillary: 82 mg/dL (ref 65–99)
Glucose-Capillary: 90 mg/dL (ref 65–99)
Glucose-Capillary: 97 mg/dL (ref 65–99)

## 2015-02-03 SURGERY — OPEN REDUCTION INTERNAL FIXATION (ORIF) TRIPOD FRACTURE
Anesthesia: General | Site: Face | Laterality: Right

## 2015-02-03 MED ORDER — ONDANSETRON HCL 4 MG/2ML IJ SOLN
INTRAMUSCULAR | Status: DC | PRN
  Administered 2015-02-03: 4 mg via INTRAVENOUS

## 2015-02-03 MED ORDER — ARTIFICIAL TEARS OP OINT
TOPICAL_OINTMENT | OPHTHALMIC | Status: AC
  Filled 2015-02-03: qty 3.5

## 2015-02-03 MED ORDER — NEOSTIGMINE METHYLSULFATE 10 MG/10ML IV SOLN
INTRAVENOUS | Status: DC | PRN
  Administered 2015-02-03: 3 mg via INTRAVENOUS

## 2015-02-03 MED ORDER — FENTANYL CITRATE (PF) 100 MCG/2ML IJ SOLN: 25.0000 ug | INTRAMUSCULAR | Status: DC | PRN

## 2015-02-03 MED ORDER — PHENYLEPHRINE 40 MCG/ML (10ML) SYRINGE FOR IV PUSH (FOR BLOOD PRESSURE SUPPORT)
PREFILLED_SYRINGE | INTRAVENOUS | Status: AC
  Filled 2015-02-03: qty 10

## 2015-02-03 MED ORDER — SODIUM CHLORIDE 0.9 % IJ SOLN
INTRAMUSCULAR | Status: AC
  Filled 2015-02-03: qty 10

## 2015-02-03 MED ORDER — CEFAZOLIN SODIUM-DEXTROSE 2-3 GM-% IV SOLR
INTRAVENOUS | Status: DC | PRN
Start: 2015-02-03 — End: 2015-02-03
  Administered 2015-02-03: 2 g via INTRAVENOUS

## 2015-02-03 MED ORDER — LIDOCAINE HCL (CARDIAC) 20 MG/ML IV SOLN
INTRAVENOUS | Status: AC
  Filled 2015-02-03: qty 5

## 2015-02-03 MED ORDER — ROCURONIUM BROMIDE 50 MG/5ML IV SOLN
INTRAVENOUS | Status: AC
  Filled 2015-02-03: qty 1

## 2015-02-03 MED ORDER — GLYCOPYRROLATE 0.2 MG/ML IJ SOLN
INTRAMUSCULAR | Status: DC | PRN
  Administered 2015-02-03: 0.4 mg via INTRAVENOUS
  Administered 2015-02-03: 0.2 mg via INTRAVENOUS

## 2015-02-03 MED ORDER — MIDAZOLAM HCL 2 MG/2ML IJ SOLN
INTRAMUSCULAR | Status: AC
  Filled 2015-02-03: qty 2

## 2015-02-03 MED ORDER — LIDOCAINE HCL (CARDIAC) 20 MG/ML IV SOLN
INTRAVENOUS | Status: DC | PRN
  Administered 2015-02-03: 60 mg via INTRAVENOUS

## 2015-02-03 MED ORDER — ROCURONIUM BROMIDE 100 MG/10ML IV SOLN
INTRAVENOUS | Status: DC | PRN
  Administered 2015-02-03: 50 mg via INTRAVENOUS

## 2015-02-03 MED ORDER — GLYCOPYRROLATE 0.2 MG/ML IJ SOLN
INTRAMUSCULAR | Status: AC
  Filled 2015-02-03: qty 3

## 2015-02-03 MED ORDER — ONDANSETRON HCL 4 MG/2ML IJ SOLN
INTRAMUSCULAR | Status: AC
Start: 2015-02-03 — End: 2015-02-03
  Filled 2015-02-03: qty 2

## 2015-02-03 MED ORDER — PHENYLEPHRINE HCL 10 MG/ML IJ SOLN
10.0000 mg | INTRAVENOUS | Status: DC | PRN
  Administered 2015-02-03: 20 ug/min via INTRAVENOUS

## 2015-02-03 MED ORDER — PROPOFOL 10 MG/ML IV BOLUS
INTRAVENOUS | Status: DC | PRN
  Administered 2015-02-03: 100 mg via INTRAVENOUS

## 2015-02-03 MED ORDER — ONDANSETRON HCL 4 MG/2ML IJ SOLN
4.0000 mg | Freq: Once | INTRAMUSCULAR | Status: DC | PRN
Start: 2015-02-03 — End: 2015-02-03

## 2015-02-03 MED ORDER — NEOSTIGMINE METHYLSULFATE 10 MG/10ML IV SOLN
INTRAVENOUS | Status: AC
  Filled 2015-02-03: qty 1

## 2015-02-03 MED ORDER — FENTANYL CITRATE (PF) 100 MCG/2ML IJ SOLN
INTRAMUSCULAR | Status: DC | PRN
  Administered 2015-02-03: 100 ug via INTRAVENOUS
  Administered 2015-02-03: 50 ug via INTRAVENOUS

## 2015-02-03 MED ORDER — LIDOCAINE-EPINEPHRINE 1 %-1:100000 IJ SOLN
INTRAMUSCULAR | Status: AC
  Filled 2015-02-03: qty 1

## 2015-02-03 MED ORDER — FENTANYL CITRATE (PF) 250 MCG/5ML IJ SOLN
INTRAMUSCULAR | Status: AC
  Filled 2015-02-03: qty 5

## 2015-02-03 MED ORDER — PROPOFOL 10 MG/ML IV BOLUS
INTRAVENOUS | Status: AC
  Filled 2015-02-03: qty 40

## 2015-02-03 MED ORDER — LACTATED RINGERS IV SOLN
INTRAVENOUS | Status: DC | PRN
  Administered 2015-02-03 (×2): via INTRAVENOUS

## 2015-02-03 MED ORDER — PHENYLEPHRINE HCL 10 MG/ML IJ SOLN
INTRAMUSCULAR | Status: DC | PRN
  Administered 2015-02-03: 40 ug via INTRAVENOUS
  Administered 2015-02-03 (×2): 80 ug via INTRAVENOUS

## 2015-02-03 MED ORDER — SODIUM CHLORIDE 0.9 % IV SOLN
INTRAVENOUS | Status: DC
  Administered 2015-02-03: 1000 mL via INTRAVENOUS

## 2015-02-03 MED ORDER — LIDOCAINE-EPINEPHRINE 1 %-1:100000 IJ SOLN
INTRAMUSCULAR | Status: DC | PRN
  Administered 2015-02-03: 20 mL

## 2015-02-03 MED ORDER — EPHEDRINE SULFATE 50 MG/ML IJ SOLN
INTRAMUSCULAR | Status: AC
  Filled 2015-02-03: qty 1

## 2015-02-03 MED ORDER — SODIUM CHLORIDE 0.9 % IR SOLN
Status: DC | PRN
  Administered 2015-02-03: 1000 mL

## 2015-02-03 MED ORDER — EPHEDRINE SULFATE 50 MG/ML IJ SOLN
INTRAMUSCULAR | Status: DC | PRN
  Administered 2015-02-03: 10 mg via INTRAVENOUS

## 2015-02-03 MED ORDER — BSS IO SOLN
INTRAOCULAR | Status: AC
  Filled 2015-02-03: qty 15

## 2015-02-03 SURGICAL SUPPLY — 36 items
BAG DECANTER FOR FLEXI CONT (MISCELLANEOUS) IMPLANT
BLADE 10 SAFETY STRL DISP (BLADE) IMPLANT
BLADE SURG 15 STRL LF DISP TIS (BLADE) IMPLANT
BLADE SURG 15 STRL SS (BLADE)
CANISTER SUCTION 2500CC (MISCELLANEOUS) ×3 IMPLANT
CLEANER TIP ELECTROSURG 2X2 (MISCELLANEOUS) IMPLANT
CONFORMERS SILICONE 5649 (OPHTHALMIC RELATED) IMPLANT
DERMABOND ADVANCED (GAUZE/BANDAGES/DRESSINGS) ×2
DERMABOND ADVANCED .7 DNX12 (GAUZE/BANDAGES/DRESSINGS) ×1 IMPLANT
DRAPE PROXIMA HALF (DRAPES) IMPLANT
ELECT COATED BLADE 2.86 ST (ELECTRODE) IMPLANT
ELECT NEEDLE BLADE 2-5/6 (NEEDLE) ×3 IMPLANT
ELECT REM PT RETURN 9FT ADLT (ELECTROSURGICAL) ×3
ELECTRODE REM PT RTRN 9FT ADLT (ELECTROSURGICAL) ×1 IMPLANT
GLOVE ECLIPSE 7.5 STRL STRAW (GLOVE) ×3 IMPLANT
GOWN STRL REUS W/ TWL LRG LVL3 (GOWN DISPOSABLE) ×1 IMPLANT
GOWN STRL REUS W/TWL LRG LVL3 (GOWN DISPOSABLE) ×2
KIT BASIN OR (CUSTOM PROCEDURE TRAY) ×3 IMPLANT
KIT ROOM TURNOVER OR (KITS) ×3 IMPLANT
NEEDLE HYPO 25GX1X1/2 BEV (NEEDLE) IMPLANT
NS IRRIG 1000ML POUR BTL (IV SOLUTION) ×3 IMPLANT
PAD ARMBOARD 7.5X6 YLW CONV (MISCELLANEOUS) ×6 IMPLANT
PENCIL BUTTON HOLSTER BLD 10FT (ELECTRODE) ×3 IMPLANT
PLATE 8 H STRAIGHT MID FACE (Plate) ×3 IMPLANT
PLATE ORBITAL RIM MID FACE (Plate) ×3 IMPLANT
SCISSORS WIRE ANG 4 3/4 DISP (INSTRUMENTS) IMPLANT
SCREW MIDFACE 1.7X3 SLF DRILL (Screw) ×15 IMPLANT
SCREW MIDFACE 1.7X4 SLF DRILL (Screw) ×18 IMPLANT
SUT PLAIN 6 0 TG1408 (SUTURE) IMPLANT
SUT PROLENE 5 0 C1 (SUTURE) IMPLANT
SUT VIC AB 3-0 FS2 27 (SUTURE) IMPLANT
SUT VICRYL 4-0 PS2 18IN ABS (SUTURE) ×3 IMPLANT
TOWEL OR 17X24 6PK STRL BLUE (TOWEL DISPOSABLE) ×3 IMPLANT
TRAY ENT MC OR (CUSTOM PROCEDURE TRAY) ×3 IMPLANT
TUBING IRRIGATION (MISCELLANEOUS) IMPLANT
WATER STERILE IRR 1000ML POUR (IV SOLUTION) IMPLANT
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
## 2015-02-03 NOTE — Progress Notes (Signed)
Patient back from OR around 1100 is incoherent and not following commands to stay in bed called ED trauma for restraint order PA acknowledged that he would write for it, rolling restarints applied to waist to restrict bed exit patient is resting comfortably, vitals are stable.

## 2015-02-03 NOTE — Progress Notes (Signed)
0300 found Pt sitting up in bed with head down left hand mitt off and laying on bed, Pt had also removed NG tube and condom catheter, Pt tube feed is on hold for A.M. surgical procedure and NPO as well. Hand mitt replaced, will monitor patient.
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
## 2015-02-03 NOTE — Anesthesia Postprocedure Evaluation (Signed)
Anesthesia Post Note  Patient: Gene Clarke  Procedure(s) Performed: Procedure(s) (LRB): OPEN REDUCTION INTERNAL FIXATION (ORIF) TRIPOD FRACTURE ; RIGHT FACE (Right)  Patient location during evaluation: PACU Anesthesia Type: General Level of consciousness: awake and alert Pain management: pain level controlled Vital Signs Assessment: post-procedure vital signs reviewed and stable Respiratory status: spontaneous breathing, nonlabored ventilation and respiratory function stable Cardiovascular status: blood pressure returned to baseline and stable Postop Assessment: no signs of nausea or vomiting Anesthetic complications: no    Last Vitals:  Filed Vitals:   02/03/15 1043 02/03/15 1334  BP: 127/79 145/85  Pulse: 72 70  Temp:  36.7 C  Resp: 19 18    Last Pain:  Filed Vitals:   02/03/15 1335  PainSc: 0-No pain                 Olivier Frayre A

## 2015-02-03 NOTE — Anesthesia Procedure Notes (Signed)
Procedure Name: Intubation Date/Time: 02/03/2015 8:43 AM Performed by: Marni Griffon Pre-anesthesia Checklist: Emergency Drugs available, Patient identified, Suction available and Patient being monitored Patient Re-evaluated:Patient Re-evaluated prior to inductionOxygen Delivery Method: Circle system utilized Preoxygenation: Pre-oxygenation with 100% oxygen Intubation Type: IV induction Ventilation: Mask ventilation without difficulty Laryngoscope Size: Mac and 3 Grade View: Grade II Tube type: Oral Tube size: 7.5 mm Airway Equipment and Method: Stylet Placement Confirmation: ETT inserted through vocal cords under direct vision,  positive ETCO2 and breath sounds checked- equal and bilateral Secured at: 21 (cm at upper gum) cm Tube secured with: Tape Dental Injury: Teeth and Oropharynx as per pre-operative assessment

## 2015-02-03 NOTE — Transfer of Care (Signed)
Immediate Anesthesia Transfer of Care Note  Patient: Gene Clarke  Procedure(s) Performed: Procedure(s): OPEN REDUCTION INTERNAL FIXATION (ORIF) TRIPOD FRACTURE ; RIGHT FACE (Right)  Patient Location: PACU  Anesthesia Type:General  Level of Consciousness: lethargic  Airway & Oxygen Therapy: Patient Spontanous Breathing, Patient connected to face mask oxygen and mental status as preop  Post-op Assessment: Report given to RN, Post -op Vital signs reviewed and stable and pt breathing and swallowing  Post vital signs: Reviewed and stable  Last Vitals:  Filed Vitals:   02/03/15 0521 02/03/15 1014  BP: 124/79   Pulse: 66   Temp: 37.2 C 36.4 C  Resp: 18     Complications: No apparent anesthesia complications

## 2015-02-03 NOTE — Therapy (Signed)
SLP Cancellation Note  Unable to complete BSE at this time, as pt is currently off unit for surgical procedure. ST to continue efforts.  Makinna Andy B. Yoceline Bazar, [facility], CCC-SLP [phone]

## 2015-02-03 NOTE — Progress Notes (Signed)
Cortrack team called and alerted to the need for another tube placement neede to restart tube feed for patient post surgery.

## 2015-02-03 NOTE — Progress Notes (Signed)
Patient taken to surgery about 0715.
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
## 2015-02-04 ENCOUNTER — Encounter (HOSPITAL_COMMUNITY): Payer: Self-pay | Admitting: Otolaryngology

## 2015-02-04 DIAGNOSIS — F1010 Alcohol abuse, uncomplicated: Secondary | ICD-10-CM | POA: Diagnosis present

## 2015-02-04 DIAGNOSIS — R911 Solitary pulmonary nodule: Secondary | ICD-10-CM | POA: Diagnosis present

## 2015-02-04 DIAGNOSIS — D62 Acute posthemorrhagic anemia: Secondary | ICD-10-CM | POA: Diagnosis not present

## 2015-02-04 LAB — GLUCOSE, CAPILLARY
GLUCOSE-CAPILLARY: 87 mg/dL (ref 65–99)
GLUCOSE-CAPILLARY: 74 mg/dL (ref 65–99)
GLUCOSE-CAPILLARY: 61 mg/dL — AB (ref 65–99)
GLUCOSE-CAPILLARY: 150 mg/dL — AB (ref 65–99)
Glucose-Capillary: 85 mg/dL (ref 65–99)
Glucose-Capillary: 101 mg/dL — ABNORMAL HIGH (ref 65–99)

## 2015-02-04 MED ORDER — THIAMINE HCL 100 MG/ML IJ SOLN: 250.0000 mg | Freq: Every day | INTRAMUSCULAR | Status: DC

## 2015-02-04 MED ORDER — THIAMINE HCL 100 MG/ML IJ SOLN
Freq: Every day | INTRAVENOUS | Status: AC
  Administered 2015-02-05 – 2015-02-09 (×5): via INTRAVENOUS
  Filled 2015-02-04 (×5): qty 50

## 2015-02-04 MED ORDER — THIAMINE HCL 100 MG/ML IJ SOLN: 250.0000 mg | Freq: Every day | INTRAVENOUS | Status: DC

## 2015-02-04 MED ORDER — THIAMINE HCL 100 MG/ML IJ SOLN
500.0000 mg | Freq: Once | INTRAVENOUS | Status: AC
  Administered 2015-02-04: 500 mg via INTRAVENOUS
  Filled 2015-02-04: qty 5

## 2015-02-04 MED ORDER — DEXTROSE 50 % IV SOLN
INTRAVENOUS | Status: AC
  Filled 2015-02-04: qty 50

## 2015-02-04 MED ORDER — DEXTROSE 50 % IV SOLN
25.0000 mL | Freq: Once | INTRAVENOUS | Status: AC
  Administered 2015-02-04: 25 mL via INTRAVENOUS

## 2015-02-04 MED ORDER — DEXTROSE-NACL 5-0.9 % IV SOLN
INTRAVENOUS | Status: DC
  Administered 2015-02-04 – 2015-02-09 (×8): via INTRAVENOUS
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
## 2015-02-04 NOTE — Progress Notes (Signed)
Pt with hypoglycemic event BS reported to this nurse -61. Initiated hypoglycemic protocol and rechecked BS- 150. Md notified, new telephone order received. No noted distress. Will continue to monitor.

## 2015-02-04 NOTE — Care Management Important Message (Signed)
Important Message  Patient Details  Name: Gene Clarke MRN: [id number] Date of Birth: 05-30-1960   Medicare Important Message Given:  Yes    [name] P [name] 02/04/2015, 2:33 PM
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
## 2015-02-04 NOTE — Progress Notes (Signed)
Hypoglycemic Event  CBG: 61  Treatment: D50 IV 25 mL  Symptoms: None  Follow-up CBG: Time: 16:59pm CBG Result: 150  Possible Reasons for Event: Other: Pt NPO  Comments/MD notified: Md notified. New orders received. Pt baseline agitation. Will continue monitor.     Delsa Bern Westley Hummer
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
## 2015-02-05 ENCOUNTER — Encounter (HOSPITAL_COMMUNITY): Payer: Self-pay | Admitting: *Deleted

## 2015-02-05 ENCOUNTER — Inpatient Hospital Stay (HOSPITAL_COMMUNITY): Payer: Medicare Other | Admitting: Certified Registered Nurse Anesthetist

## 2015-02-05 ENCOUNTER — Encounter (HOSPITAL_COMMUNITY): Admission: EM | Disposition: A | Discharge status: Skilled Nursing Facility | Payer: Self-pay | Source: Home / Self Care

## 2015-02-05 HISTORY — PX: PEG PLACEMENT: SHX5437

## 2015-02-05 HISTORY — PX: ESOPHAGOGASTRODUODENOSCOPY (EGD) WITH PROPOFOL: SHX5813

## 2015-02-05 LAB — GLUCOSE, CAPILLARY
GLUCOSE-CAPILLARY: 87 mg/dL (ref 65–99)
GLUCOSE-CAPILLARY: 97 mg/dL (ref 65–99)
GLUCOSE-CAPILLARY: 97 mg/dL (ref 65–99)
Glucose-Capillary: 109 mg/dL — ABNORMAL HIGH (ref 65–99)
Glucose-Capillary: 98 mg/dL (ref 65–99)
Glucose-Capillary: 86 mg/dL (ref 65–99)

## 2015-02-05 SURGERY — INSERTION, PEG TUBE
Anesthesia: Monitor Anesthesia Care

## 2015-02-05 MED ORDER — LACTATED RINGERS IV SOLN
INTRAVENOUS | Status: DC | PRN
  Administered 2015-02-05: 14:00:00 via INTRAVENOUS

## 2015-02-05 MED ORDER — FENTANYL CITRATE (PF) 100 MCG/2ML IJ SOLN: 25.0000 ug | INTRAMUSCULAR | Status: DC | PRN

## 2015-02-05 MED ORDER — PROPOFOL 500 MG/50ML IV EMUL
INTRAVENOUS | Status: DC | PRN
  Administered 2015-02-05: 30 ug/kg/min via INTRAVENOUS

## 2015-02-05 MED ORDER — PROPOFOL 10 MG/ML IV BOLUS
INTRAVENOUS | Status: DC | PRN
  Administered 2015-02-05 (×2): 20 mg via INTRAVENOUS

## 2015-02-05 MED ORDER — ONDANSETRON HCL 4 MG/2ML IJ SOLN: 4.0000 mg | Freq: Once | INTRAMUSCULAR | Status: DC | PRN

## 2015-02-05 NOTE — Op Note (Signed)
OPERATIVE REPORT  DATE OF OPERATION:  02/05/2015  PATIENT:  Gene Clarke  55 y.o. male  PRE-OPERATIVE DIAGNOSIS:  feeding difficulties  POST-OPERATIVE DIAGNOSIS:  PEG placement  PROCEDURE:  Procedure(s): PERCUTANEOUS ENDOSCOPIC GASTROSTOMY (PEG) PLACEMENT  SURGEON:  Surgeon(s): Jimmye Norman, MD  ASSISTANT: [place], NP-C  ANESTHESIA:   local, IV sedation and MAC  EBL: <10 ml  BLOOD ADMINISTERED: none  DRAINS: Gastrostomy Tube   SPECIMEN:  No Specimen  COUNTS CORRECT:  YES  PROCEDURE DETAILS: The patient was taken to the endoscopy suite for this procedure. Was done under monitored anesthesia care.  After an adequate amount of IV sedation was given along with a monitored anesthesia care anesthetic his abdomen was prepped and draped in usual sterile manner. A proper timeout was performed identifying the patient and procedure to be performed.  The patient's oral cavity was used to access the upper esophagus the body of the stomach however cannot passed the scope into the duodenum through the pylorus. Retracted the Pentax [id number] endoscope back into the body of the stomach where the impulse from the assistant could be seen on the anterior abdominal wall. Was at that site that incision was made and an angiocatheter passed directly into the stomach. A firm stiff green wire specimen angiocatheter and port out through the patient's mouth using the endoscope. We subsequently passed a wire through the push through gastrostomy tube out through the anterior abdominal wall and subsequent reported to his adequate position.  Once that was done this secured in place in the usual sterile manner. Pictures were taken of the gastrostomy tube in the proper position. All needle counts, sponge counts and instrument counts were correct.  PATIENT DISPOSITION:  PACU - hemodynamically stable.        Aviva Wolfer 1/27/20172:31 PM

## 2015-02-05 NOTE — Anesthesia Postprocedure Evaluation (Signed)
Anesthesia Post Note  Patient: Gene Clarke  Procedure(s) Performed: Procedure(s) (LRB): OPEN REDUCTION INTERNAL FIXATION (ORIF) TRIPOD FRACTURE ; RIGHT FACE (Right)  Patient location during evaluation: PACU Anesthesia Type: General Level of consciousness: awake and alert Pain management: pain level controlled Vital Signs Assessment: post-procedure vital signs reviewed and stable Respiratory status: spontaneous breathing, nonlabored ventilation, respiratory function stable and patient connected to nasal cannula oxygen Cardiovascular status: blood pressure returned to baseline and stable Postop Assessment: no signs of nausea or vomiting Anesthetic complications: no    Last Vitals:  Filed Vitals:   02/05/15 0553 02/05/15 0901  BP: 133/73 140/71  Pulse: 55 59  Temp: 36.9 C 36.6 C  Resp: 20 16    Last Pain:  Filed Vitals:   02/05/15 0902  PainSc: Asleep                 Cecile Hearing

## 2015-02-05 NOTE — Progress Notes (Signed)
Speech Pathology: Treatment canceled - pt in endo for PEG. Will follow. Nariya Neumeyer L. Samson Frederic, [place] CCC/SLP Pager [phone]
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
## 2015-02-05 NOTE — Transfer of Care (Signed)
Immediate Anesthesia Transfer of Care Note  Patient: Gene Clarke  Procedure(s) Performed: Procedure(s): PERCUTANEOUS ENDOSCOPIC GASTROSTOMY (PEG) PLACEMENT (N/A)  Patient Location: PACU  Anesthesia Type:MAC  Level of Consciousness: awake  Airway & Oxygen Therapy: Patient Spontanous Breathing  Post-op Assessment: Report given to RN and Post -op Vital signs reviewed and stable  Post vital signs: stable  Last Vitals:  Filed Vitals:   02/05/15 1301 02/05/15 1342  BP: 146/71 147/78  Pulse: 61 57  Temp: 36.6 C   Resp: 20     Complications: No apparent anesthesia complications

## 2015-02-05 NOTE — Progress Notes (Signed)
Orthopedic Tech Progress Note Patient Details:  Gene Clarke 02-17-1960 [id number]  Ortho Devices Type of Ortho Device: Abdominal binder Ortho Device/Splint Location: at bedside Ortho Device/Splint Interventions: Casandra Doffing 02/05/2015, 8:54 PM

## 2015-02-05 NOTE — Anesthesia Postprocedure Evaluation (Signed)
Anesthesia Post Note  Patient: Gene Clarke  Procedure(s) Performed: Procedure(s) (LRB): PERCUTANEOUS ENDOSCOPIC GASTROSTOMY (PEG) PLACEMENT (N/A)  Patient location during evaluation: PACU Anesthesia Type: MAC Level of consciousness: confused Pain management: pain level controlled Vital Signs Assessment: post-procedure vital signs reviewed and stable Respiratory status: spontaneous breathing Cardiovascular status: stable Anesthetic complications: no    Last Vitals:  Filed Vitals:   02/05/15 1437 02/05/15 1445  BP: 180/86 183/76  Pulse: 55 53  Temp:    Resp: 12 21    Last Pain:  Filed Vitals:   02/05/15 1452  PainSc: 0-No pain                 Lewie Loron

## 2015-02-05 NOTE — Progress Notes (Signed)
Pt unable to consent to peg placement called mother listed but the number is disconnected and no family has been to visit. Md notified. Will continue to attempt to get consent.
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
## 2015-02-05 NOTE — Progress Notes (Signed)
Pt returned with peg dressing clean, dry and intact. No noted distress. Will continue to monitor.

## 2015-02-06 LAB — GLUCOSE, CAPILLARY
GLUCOSE-CAPILLARY: 115 mg/dL — AB (ref 65–99)
Glucose-Capillary: 103 mg/dL — ABNORMAL HIGH (ref 65–99)
Glucose-Capillary: 109 mg/dL — ABNORMAL HIGH (ref 65–99)
Glucose-Capillary: 122 mg/dL — ABNORMAL HIGH (ref 65–99)
Glucose-Capillary: 114 mg/dL — ABNORMAL HIGH (ref 65–99)

## 2015-02-06 MED ORDER — FREE WATER
200.0000 mL | Freq: Three times a day (TID) | Status: DC
  Administered 2015-02-06 – 2015-02-10 (×13): 200 mL
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
## 2015-02-07 LAB — CBC
HCT: 33.5 % — ABNORMAL LOW (ref 39.0–52.0)
HEMOGLOBIN: 11.5 g/dL — AB (ref 13.0–17.0)
MCH: 31.9 pg (ref 26.0–34.0)
MCHC: 34.3 g/dL (ref 30.0–36.0)
MCV: 92.8 fL (ref 78.0–100.0)
Platelets: 486 10*3/uL — ABNORMAL HIGH (ref 150–400)
RBC: 3.61 MIL/uL — AB (ref 4.22–5.81)
RDW: 13.2 % (ref 11.5–15.5)
WBC: 16.1 10*3/uL — ABNORMAL HIGH (ref 4.0–10.5)

## 2015-02-07 LAB — GLUCOSE, CAPILLARY
GLUCOSE-CAPILLARY: 111 mg/dL — AB (ref 65–99)
GLUCOSE-CAPILLARY: 125 mg/dL — AB (ref 65–99)
Glucose-Capillary: 133 mg/dL — ABNORMAL HIGH (ref 65–99)
Glucose-Capillary: 108 mg/dL — ABNORMAL HIGH (ref 65–99)
Glucose-Capillary: 110 mg/dL — ABNORMAL HIGH (ref 65–99)

## 2015-02-07 LAB — BASIC METABOLIC PANEL
ANION GAP: 10 (ref 5–15)
BUN: 7 mg/dL (ref 6–20)
CHLORIDE: 103 mmol/L (ref 101–111)
CO2: 23 mmol/L (ref 22–32)
Calcium: 8.8 mg/dL — ABNORMAL LOW (ref 8.9–10.3)
Creatinine, Ser: 0.44 mg/dL — ABNORMAL LOW (ref 0.61–1.24)
GFR calc Af Amer: >60 mL/min (ref >60)
GFR calc non Af Amer: >60 mL/min (ref >60)
GLUCOSE: 118 mg/dL — AB (ref 65–99)
POTASSIUM: 3.6 mmol/L (ref 3.5–5.1)
Sodium: 136 mmol/L (ref 135–145)

## 2015-02-07 MED ORDER — CHLORHEXIDINE GLUCONATE 0.12 % MT SOLN
15.0000 mL | Freq: Two times a day (BID) | OROMUCOSAL | Status: DC
Start: 2015-02-07 — End: 2015-02-10
  Administered 2015-02-07 – 2015-02-10 (×7): 15 mL via OROMUCOSAL
  Filled 2015-02-07 (×7): qty 15

## 2015-02-07 MED ORDER — CETYLPYRIDINIUM CHLORIDE 0.05 % MT LIQD: 7.0000 mL | Freq: Two times a day (BID) | OROMUCOSAL | Status: DC
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
## 2015-02-08 ENCOUNTER — Encounter (HOSPITAL_COMMUNITY): Payer: Self-pay | Admitting: General Surgery

## 2015-02-08 LAB — COMPREHENSIVE METABOLIC PANEL
ALK PHOS: 85 U/L (ref 38–126)
ALT: 10 U/L — AB (ref 17–63)
ANION GAP: 10 (ref 5–15)
AST: 18 U/L (ref 15–41)
Albumin: 2.5 g/dL — ABNORMAL LOW (ref 3.5–5.0)
BUN: 8 mg/dL (ref 6–20)
CALCIUM: 8.6 mg/dL — AB (ref 8.9–10.3)
CO2: 21 mmol/L — ABNORMAL LOW (ref 22–32)
CREATININE: 0.34 mg/dL — AB (ref 0.61–1.24)
Chloride: 101 mmol/L (ref 101–111)
Glucose, Bld: 122 mg/dL — ABNORMAL HIGH (ref 65–99)
Potassium: 3.8 mmol/L (ref 3.5–5.1)
Sodium: 132 mmol/L — ABNORMAL LOW (ref 135–145)
Total Bilirubin: <0.1 mg/dL — ABNORMAL LOW (ref 0.3–1.2)
Total Protein: 6.3 g/dL — ABNORMAL LOW (ref 6.5–8.1)

## 2015-02-08 LAB — GLUCOSE, CAPILLARY
GLUCOSE-CAPILLARY: 100 mg/dL — AB (ref 65–99)
GLUCOSE-CAPILLARY: 105 mg/dL — AB (ref 65–99)
GLUCOSE-CAPILLARY: 119 mg/dL — AB (ref 65–99)
GLUCOSE-CAPILLARY: 120 mg/dL — AB (ref 65–99)
Glucose-Capillary: 124 mg/dL — ABNORMAL HIGH (ref 65–99)
Glucose-Capillary: 110 mg/dL — ABNORMAL HIGH (ref 65–99)
Glucose-Capillary: 97 mg/dL (ref 65–99)

## 2015-02-08 LAB — CBC
HCT: 32.7 % — ABNORMAL LOW (ref 39.0–52.0)
HEMOGLOBIN: 11 g/dL — AB (ref 13.0–17.0)
MCH: 31.2 pg (ref 26.0–34.0)
MCHC: 33.6 g/dL (ref 30.0–36.0)
MCV: 92.6 fL (ref 78.0–100.0)
PLATELETS: 460 10*3/uL — AB (ref 150–400)
RBC: 3.53 MIL/uL — AB (ref 4.22–5.81)
RDW: 13.3 % (ref 11.5–15.5)
WBC: 14.6 10*3/uL — AB (ref 4.0–10.5)

## 2015-02-08 MED ORDER — QUETIAPINE FUMARATE 50 MG PO TABS
100.0000 mg | ORAL_TABLET | Freq: Two times a day (BID) | ORAL | Status: DC
  Administered 2015-02-08 (×2): 100 mg
  Filled 2015-02-08 (×2): qty 2

## 2015-02-08 NOTE — Care Management Important Message (Signed)
Important Message  Patient Details  Name: Gene Clarke MRN: [id number] Date of Birth: 07-26-1960   Medicare Important Message Given:  Yes    Sydney Hasten P Florene Brill 02/08/2015, 2:53 PM
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
## 2015-02-09 LAB — GLUCOSE, CAPILLARY
GLUCOSE-CAPILLARY: 106 mg/dL — AB (ref 65–99)
GLUCOSE-CAPILLARY: 95 mg/dL (ref 65–99)
Glucose-Capillary: 121 mg/dL — ABNORMAL HIGH (ref 65–99)
Glucose-Capillary: 96 mg/dL (ref 65–99)

## 2015-02-09 MED ORDER — JEVITY 1.2 CAL PO LIQD
1000.0000 mL | ORAL | Status: DC
  Administered 2015-02-09: 1000 mL
  Filled 2015-02-09 (×4): qty 1000

## 2015-02-09 MED ORDER — GUAIFENESIN 100 MG/5ML PO SOLN
15.0000 mL | Freq: Four times a day (QID) | ORAL | Status: DC
  Administered 2015-02-09 – 2015-02-10 (×4): 300 mg
  Filled 2015-02-09: qty 30
  Filled 2015-02-09: qty 10
  Filled 2015-02-09: qty 20
  Filled 2015-02-09: qty 10
  Filled 2015-02-09: qty 20

## 2015-02-09 MED ORDER — QUETIAPINE FUMARATE 50 MG PO TABS
100.0000 mg | ORAL_TABLET | Freq: Three times a day (TID) | ORAL | Status: DC
  Administered 2015-02-09 – 2015-02-10 (×5): 100 mg
  Filled 2015-02-09 (×5): qty 2
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
## 2015-02-10 LAB — GLUCOSE, CAPILLARY
GLUCOSE-CAPILLARY: 93 mg/dL (ref 65–99)
Glucose-Capillary: 95 mg/dL (ref 65–99)
Glucose-Capillary: 110 mg/dL — ABNORMAL HIGH (ref 65–99)
Glucose-Capillary: 140 mg/dL — ABNORMAL HIGH (ref 65–99)

## 2015-02-10 MED ORDER — LORAZEPAM 2 MG/ML IJ SOLN
1.0000 mg | INTRAMUSCULAR | Status: DC | PRN
  Filled 2015-02-10: qty 1

## 2015-02-10 MED ORDER — JEVITY 1.2 CAL PO LIQD: 1000.0000 mL | ORAL | Status: AC

## 2015-02-10 MED ORDER — FREE WATER
200.0000 mL | Freq: Three times a day (TID) | Status: AC
Start: 2015-02-10 — End: ?

## 2015-02-10 MED ORDER — QUETIAPINE FUMARATE 100 MG PO TABS: 100.0000 mg | ORAL_TABLET | Freq: Three times a day (TID) | ORAL | Status: AC

## 2015-02-10 MED ORDER — PANTOPRAZOLE SODIUM 40 MG PO PACK: 40.0000 mg | PACK | Freq: Every day | ORAL | Status: AC
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
## 2015-02-10 NOTE — Clinical Social Work Placement (Signed)
   CLINICAL SOCIAL WORK PLACEMENT  NOTE  Date:  02/10/2015  Patient Details  Name: Gene Clarke MRN: [id number] Date of Birth: November 01, 1960  Clinical Social Work is seeking post-discharge placement for this patient at the Skilled  Nursing Facility level of care (*CSW will initial, date and re-position this form in  chart as items are completed):  Yes   Patient/family provided with [place] Clinical Social Work Department's list of facilities offering this level of care within the geographic area requested by the patient (or if unable, by the patient's family).  Yes   Patient/family informed of their freedom to choose among providers that offer the needed level of care, that participate in Medicare, Medicaid or managed care program needed by the patient, have an available bed and are willing to accept the patient.  Yes   Patient/family informed of [place]'s ownership interest in [facility] and [facility], as well as of the fact that they are under no obligation to receive care at these facilities.  PASRR submitted to EDS on 02/10/15     PASRR number received on 02/10/15     Existing PASRR number confirmed on       FL2 transmitted to all facilities in geographic area requested by pt/family on 02/10/15     FL2 transmitted to all facilities within larger geographic area on       Patient informed that his/her managed care company has contracts with or will negotiate with certain facilities, including the following:        Yes   Patient/family informed of bed offers received.  Patient chooses bed at [facility]     Physician recommends and patient chooses bed at      Patient to be transferred to [facility] on 02/10/15.  Patient to be transferred to facility by ambulance     Patient family notified on 02/10/15 of transfer.  Name of family member notified:  April, Tony, and [place]     PHYSICIAN Please prepare priority discharge summary,  including medications, Please prepare prescriptions, Please sign FL2     Additional Comment:  Per MD patient ready for DC to [facility]. RN, patient, patient's family, and facility notified of DC. RN given number for report. DC packet on chart. Ambulance transport requested for patient. CSW signing off.   _______________________________________________ Venita Lick, LCSW 02/10/2015, 3:22 PM
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
## 2015-02-11 ENCOUNTER — Encounter (HOSPITAL_COMMUNITY): Payer: Self-pay | Admitting: Emergency Medicine

## 2017-11-22 IMAGING — CT CT MAXILLOFACIAL W/O CM
3 series · 14 of 47 positions shown, 16 images · non-contrast
Comparison: None.

CLINICAL DATA: Maxillary fracture noted on CT. Further evaluation
recommended. Initial encounter.

EXAM:
CT MAXILLOFACIAL WITHOUT CONTRAST
TECHNIQUE: Multidetector CT imaging of the maxillofacial structures was
performed. Multiplanar CT image reconstructions were also generated.
A small metallic BB was placed on the right temple in order to
reliably differentiate right from left.

[Series 2: facialbone 2.0 st · axial · 0.40mm/px · z∈[+105,+247]mm · 8 of 83 slices shown, 10 images]
[im 6/83  brain]
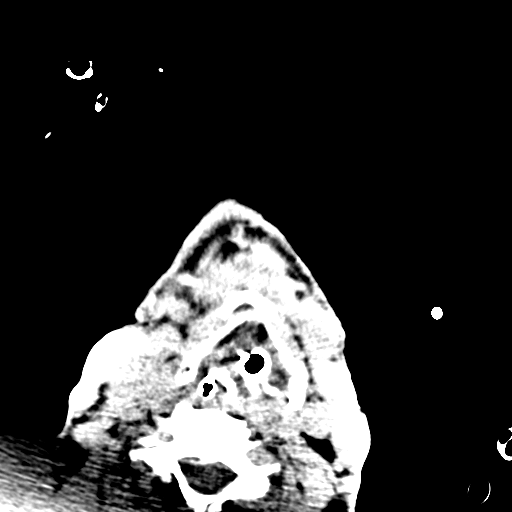
[im 6/83  bone]
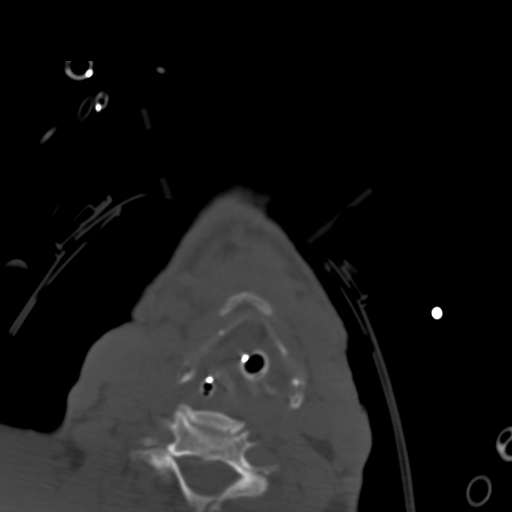
[im 17/83  bone]
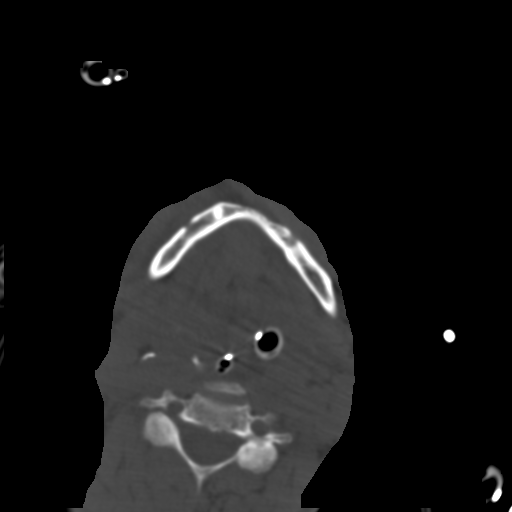
[im 26/83  bone]
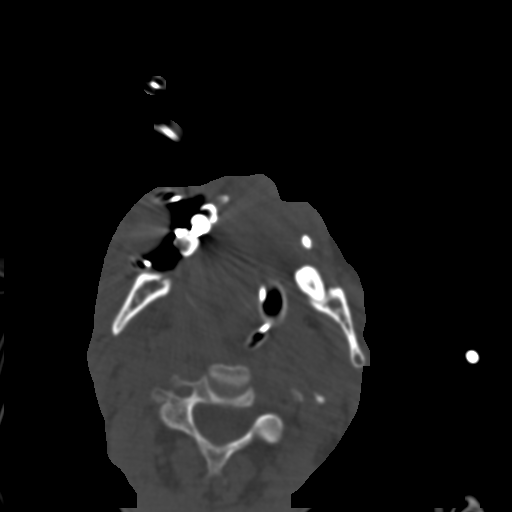
[im 37/83  bone]
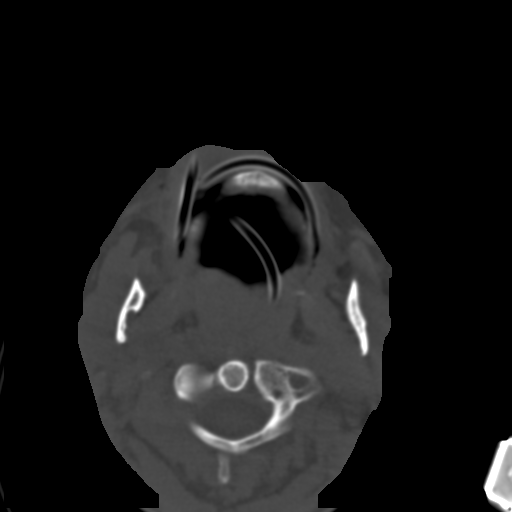
[im 46/83  brain]
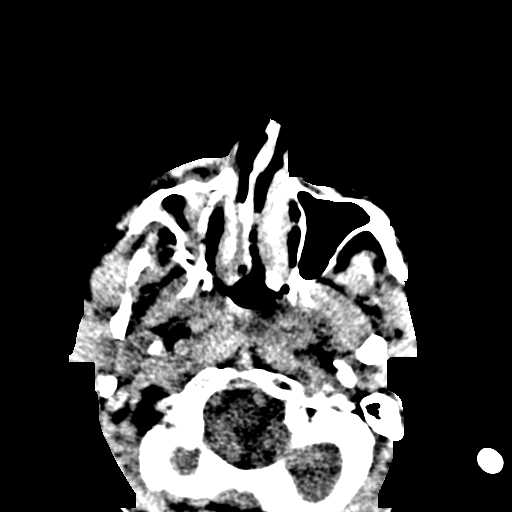
[im 46/83  bone]
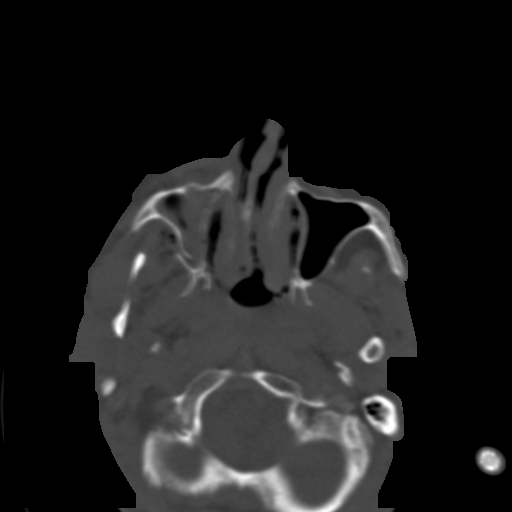
[im 57/83  bone]
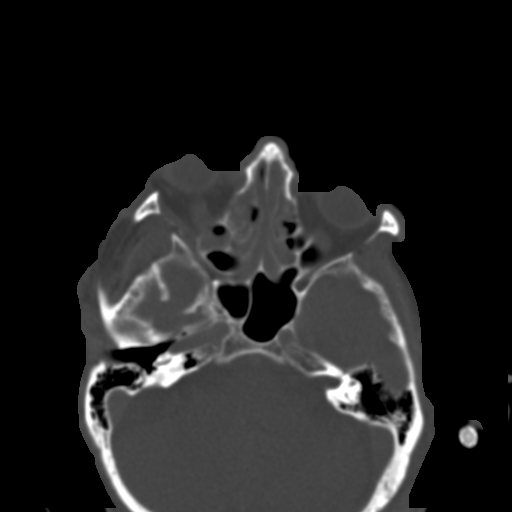
[im 66/83  bone]
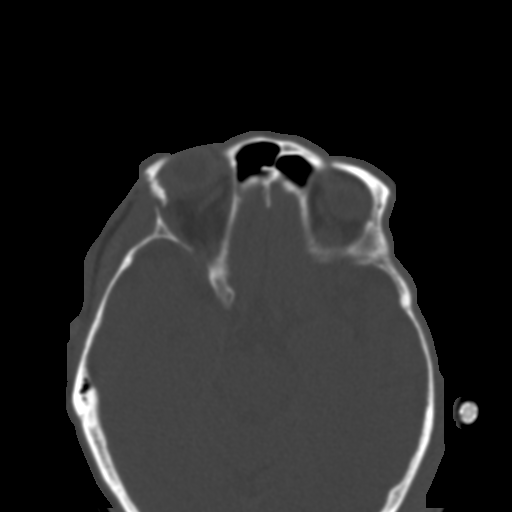
[im 77/83  bone]
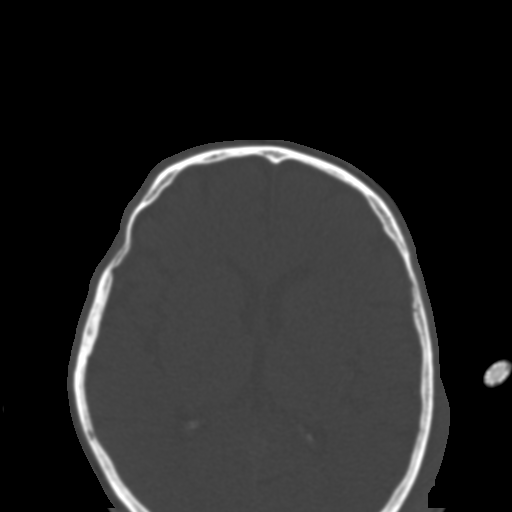

[Series 5: facialbone 2.0 cor st · coronal · 0.32mm/px · 3 of 74 slices shown]
[im 25/74  bone]
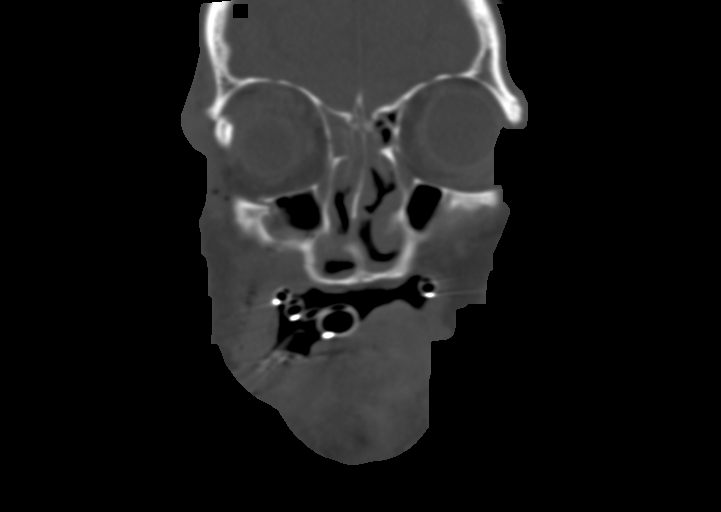
[im 33/74  bone]
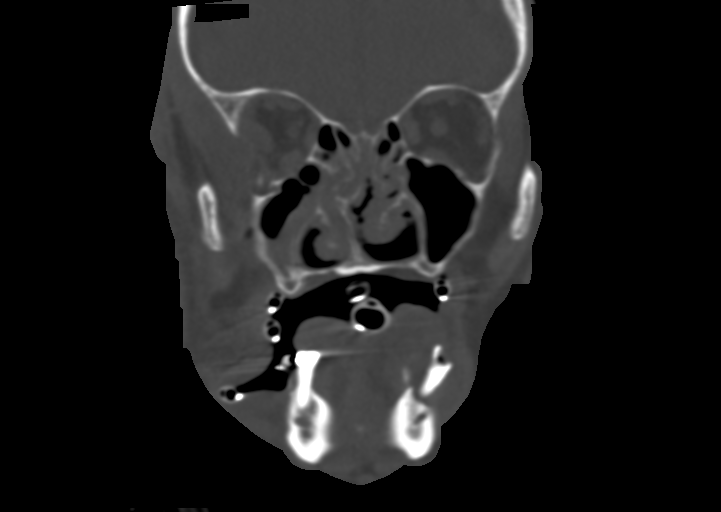
[im 41/74  bone]
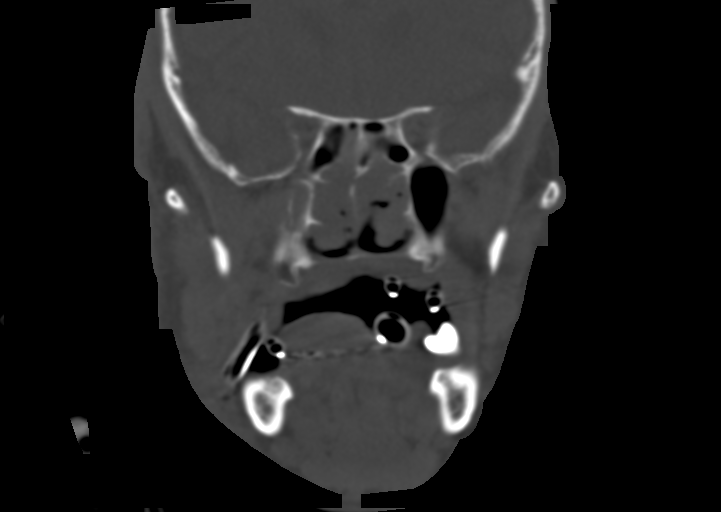

[Series 7: facialbone 2.0 sag st · sagittal · 0.31mm/px · 3 of 79 slices shown]
[im 27/79  bone]
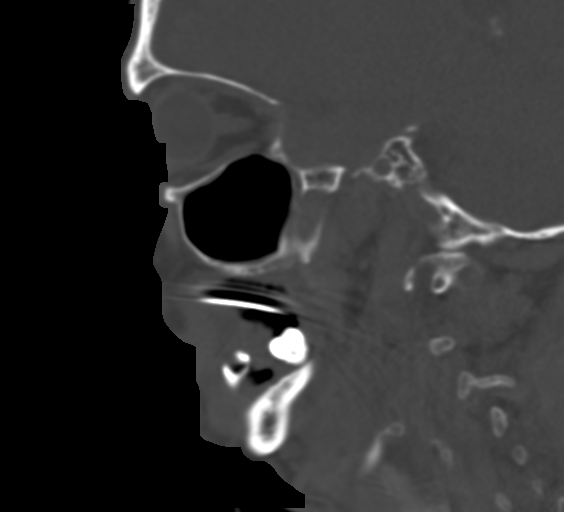
[im 40/79  bone]
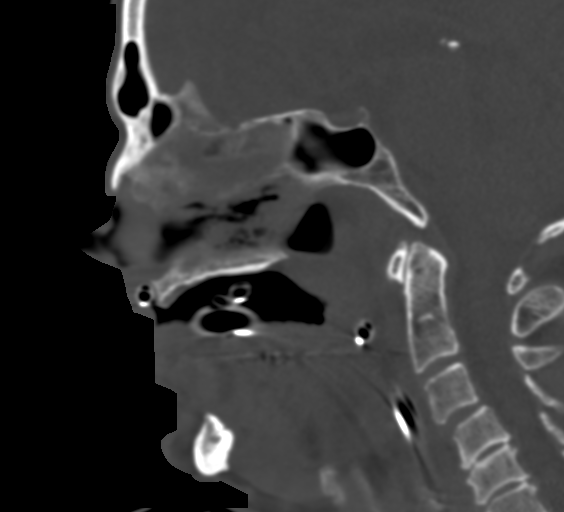
[im 53/79  bone]
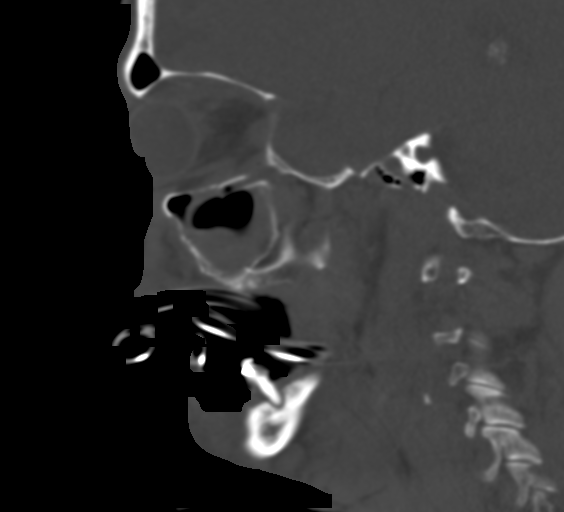

[14 of 47 positions shown; findings below may reference images not displayed]

FINDINGS: There appears to be a tetrapod fracture of the right
zygomaticomaxillary complex, with minimal deformity of the right
zygomatic arch, and mild displacement at the anterior and lateral
walls of the right maxillary sinus. There is comminution of the
fracture of the lateral wall of the right maxillary sinus. A
medially displaced superior buttress fracture is also seen.

Overlying scattered soft tissue air is seen tracking about the
maxilla. There is mild right-sided proptosis, reflecting a small
amount of hemorrhage posterior to the right optic globe, without
evidence of significant intraorbital hematoma or herniation of
intraorbital fat. Trace blood is suggested along the right orbital
floor.

The mandible appears intact. The nasal bone is unremarkable in
appearance. Multiple prominent dental caries are noted with regard
to the remaining mandibular teeth.

The left orbit appears intact. There is partial opacification of the
right maxillary sinus with blood. The remaining visualized paranasal
sinuses and mastoid air cells are well-aerated.

Soft tissue swelling is noted about the right orbit, with associated
soft tissue air overlying the maxilla. Soft tissue swelling is noted
overlying the left parietal calvarium. The parapharyngeal fat planes
are preserved. The nasopharynx, oropharynx and hypopharynx are
unremarkable in appearance. The visualized portions of the
valleculae and piriform sinuses are grossly unremarkable.

The parotid and submandibular glands are within normal limits. No
cervical lymphadenopathy is seen. The patient's enteric tube appears
to be coiled within the patient's mouth.
IMPRESSION: 1. Apparent tetrapod fracture of the right zygomaticomaxillary
complex, with comminution at the lateral wall of the right maxillary
sinus. Medially displaced superior buttress fracture noted.
2. Mild right-sided proptosis, reflecting a small amount of
hemorrhage posterior to the right optic globe, without significant
focal hematoma or herniation of intraorbital fat. Trace blood also
suggested along the right orbital floor.
3. Partial opacification of the right maxillary sinus with blood.
4. Soft tissue swelling about the right orbit, with associated soft
tissue air overlying the maxilla. Soft tissue swelling noted
overlying the left parietal calvarium.
5. Multiple prominent dental caries with regard to the remaining
mandibular teeth.
6. Enteric tube appears to be coiled within the patient's mouth.
This should be retracted somewhat.
These results were called by telephone at the time of interpretation
on 01/23/2015 at [DATE] to Nursing in the [HOSPITAL] ICU, who
verbally acknowledged these results.
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
## 2017-11-24 IMAGING — CR DG CHEST 1V PORT
1 series · 1 of 1 positions shown · non-contrast
Comparison: Portable exam 9647 hours compared to 01/23/2015

CLINICAL DATA: Infiltrate, smoker

EXAM:
PORTABLE CHEST 1 VIEW

[AP]
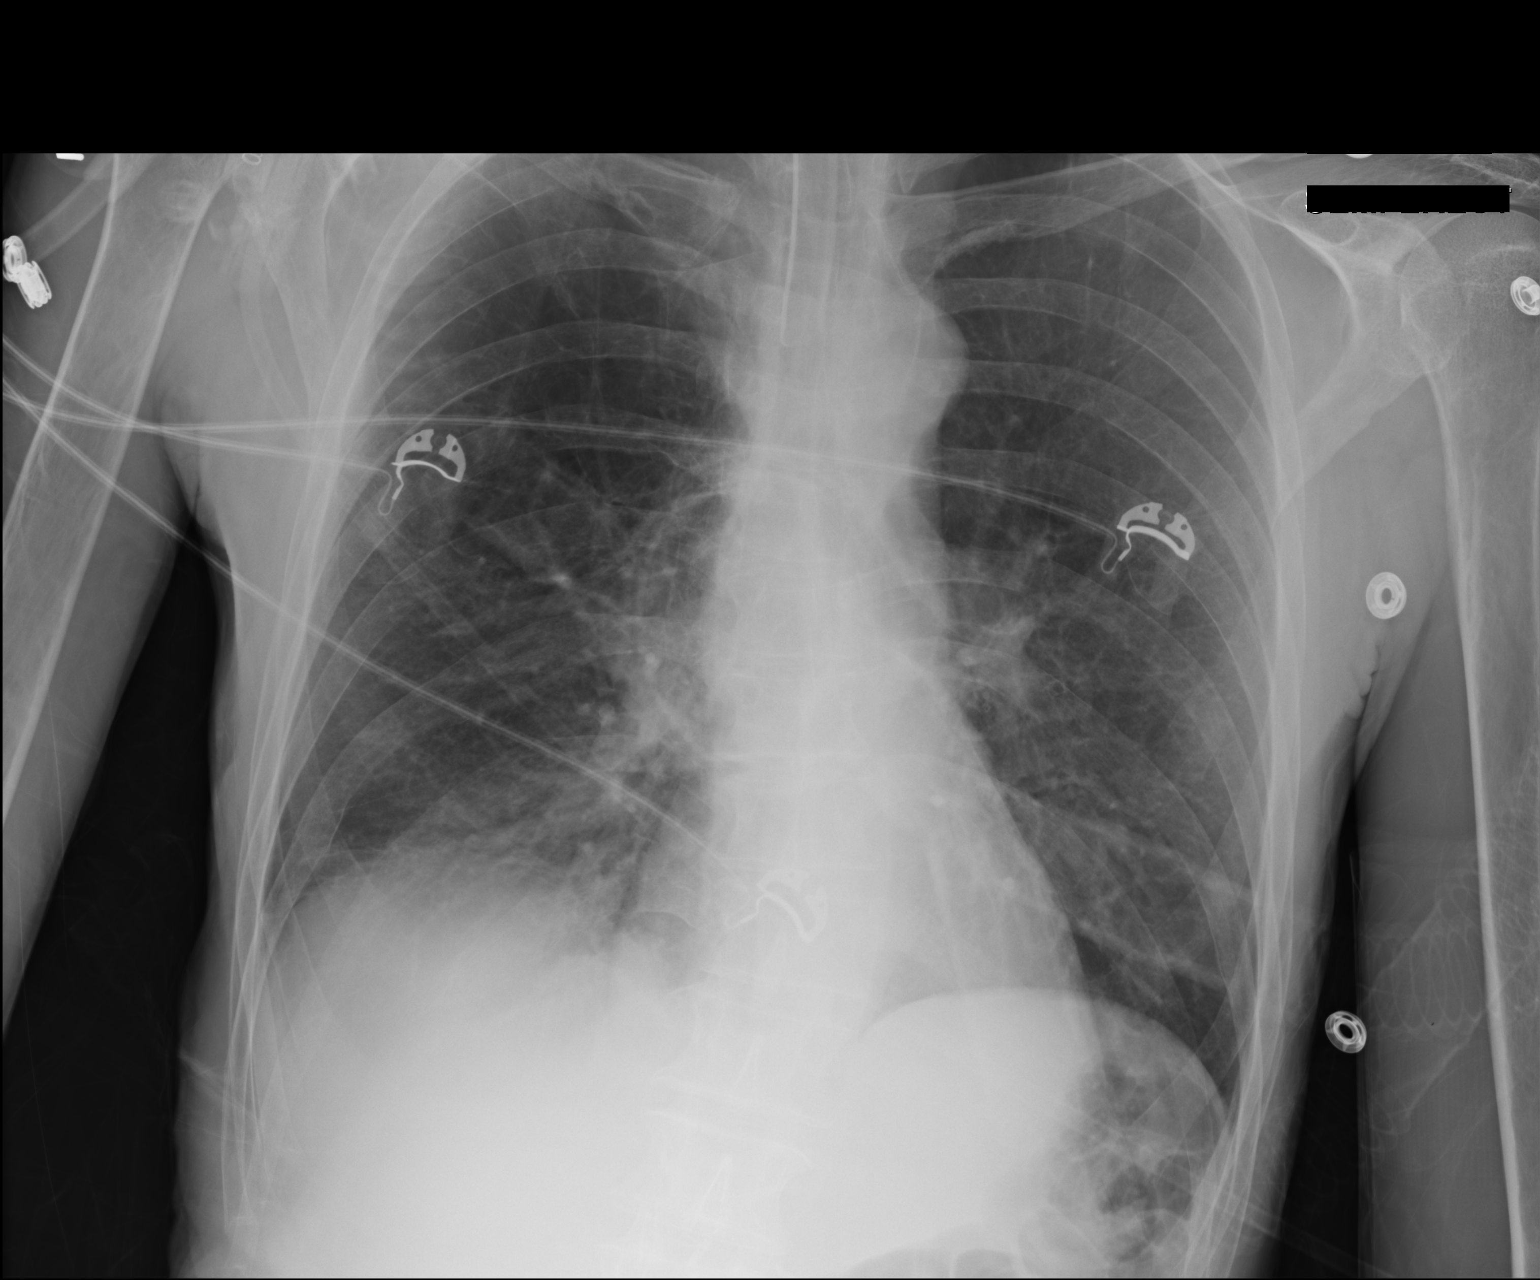

[1 of 1 positions shown; findings below may reference images not displayed]

FINDINGS: Tip of endotracheal tube projects 5.2 cm above carina.

Normal heart size, mediastinal contours, and pulmonary vascularity.

RIGHT lower lobe opacity question pneumonia versus atelectasis.

Remaining lungs emphysematous but grossly clear.

No pleural effusion or pneumothorax.

Bones demineralized.
IMPRESSION: COPD changes with persistent RIGHT lower lobe opacification question
pneumonia versus atelectasis.

Little interval change.
